# Patient Record
Sex: Female | Born: 1945 | Race: White | Hispanic: No | Marital: Married | Smoking: Never smoker
Health system: Southern US, Community
[De-identification: ages and names within clinical notes are randomized; demographics above are authoritative.]

## PROBLEM LIST (undated history)

## (undated) DIAGNOSIS — J449 Chronic obstructive pulmonary disease, unspecified: Secondary | ICD-10-CM

## (undated) DIAGNOSIS — M25579 Pain in unspecified ankle and joints of unspecified foot: Secondary | ICD-10-CM

## (undated) DIAGNOSIS — F32A Depression, unspecified: Secondary | ICD-10-CM

## (undated) DIAGNOSIS — G3184 Mild cognitive impairment, so stated: Secondary | ICD-10-CM

## (undated) DIAGNOSIS — M810 Age-related osteoporosis without current pathological fracture: Secondary | ICD-10-CM

## (undated) DIAGNOSIS — R634 Abnormal weight loss: Secondary | ICD-10-CM

## (undated) DIAGNOSIS — E785 Hyperlipidemia, unspecified: Secondary | ICD-10-CM

## (undated) DIAGNOSIS — M8580 Other specified disorders of bone density and structure, unspecified site: Secondary | ICD-10-CM

## (undated) DIAGNOSIS — J309 Allergic rhinitis, unspecified: Secondary | ICD-10-CM

## (undated) DIAGNOSIS — L84 Corns and callosities: Secondary | ICD-10-CM

## (undated) DIAGNOSIS — I700 Atherosclerosis of aorta: Secondary | ICD-10-CM

## (undated) DIAGNOSIS — D229 Melanocytic nevi, unspecified: Secondary | ICD-10-CM

## (undated) HISTORY — DX: Melanocytic nevi, unspecified: D22.9

## (undated) HISTORY — DX: Allergic rhinitis, unspecified: J30.9

## (undated) HISTORY — DX: Other specified disorders of bone density and structure, unspecified site: M85.80

## (undated) HISTORY — DX: Atherosclerosis of aorta: I70.0

## (undated) HISTORY — DX: Pain in unspecified ankle and joints of unspecified foot: M25.579

## (undated) HISTORY — DX: Hyperlipidemia, unspecified: E78.5

## (undated) HISTORY — DX: Abnormal weight loss: R63.4

## (undated) HISTORY — DX: Mild cognitive impairment of uncertain or unknown etiology: G31.84

## (undated) HISTORY — DX: Corns and callosities: L84

## (undated) HISTORY — DX: Age-related osteoporosis without current pathological fracture: M81.0

## (undated) HISTORY — DX: Chronic obstructive pulmonary disease, unspecified: J44.9

## (undated) HISTORY — DX: Depression, unspecified: F32.A

---

## 2015-01-07 DIAGNOSIS — Z0000 Encounter for general adult medical examination without abnormal findings: Secondary | ICD-10-CM | POA: Insufficient documentation

## 2015-05-13 ENCOUNTER — Other Ambulatory Visit: Payer: Self-pay | Admitting: Internal Medicine

## 2015-05-13 DIAGNOSIS — R634 Abnormal weight loss: Secondary | ICD-10-CM

## 2015-05-14 ENCOUNTER — Ambulatory Visit
Admission: RE | Admit: 2015-05-14 | Discharge: 2015-05-14 | Disposition: A | Discharge status: Home / Self Care | Payer: Medicare Other | Source: Ambulatory Visit | Attending: Internal Medicine | Admitting: Internal Medicine

## 2015-05-14 DIAGNOSIS — R634 Abnormal weight loss: Secondary | ICD-10-CM

## 2015-05-14 MED ORDER — IOPAMIDOL (ISOVUE-300) INJECTION 61%
100.0000 mL | Freq: Once | INTRAVENOUS | Status: AC | PRN
  Administered 2015-05-14: 100 mL via INTRAVENOUS

## 2015-06-11 ENCOUNTER — Other Ambulatory Visit: Payer: Self-pay

## 2015-06-11 DIAGNOSIS — Z1231 Encounter for screening mammogram for malignant neoplasm of breast: Secondary | ICD-10-CM

## 2015-09-06 ENCOUNTER — Other Ambulatory Visit: Payer: Self-pay | Admitting: Gastroenterology

## 2015-09-06 DIAGNOSIS — K8689 Other specified diseases of pancreas: Secondary | ICD-10-CM

## 2015-09-17 ENCOUNTER — Ambulatory Visit
Admission: RE | Admit: 2015-09-17 | Discharge: 2015-09-17 | Disposition: A | Discharge status: Home / Self Care | Payer: Medicare Other | Source: Ambulatory Visit | Attending: Gastroenterology | Admitting: Gastroenterology

## 2015-09-17 ENCOUNTER — Other Ambulatory Visit: Payer: Medicare Other

## 2015-09-17 DIAGNOSIS — K8689 Other specified diseases of pancreas: Secondary | ICD-10-CM

## 2015-09-17 MED ORDER — GADOBENATE DIMEGLUMINE 529 MG/ML IV SOLN
10.0000 mL | Freq: Once | INTRAVENOUS | Status: AC | PRN
  Administered 2015-09-17: 10 mL via INTRAVENOUS

## 2016-09-15 ENCOUNTER — Ambulatory Visit (INDEPENDENT_AMBULATORY_CARE_PROVIDER_SITE_OTHER): Payer: Medicare Other | Admitting: Podiatry

## 2016-09-15 ENCOUNTER — Ambulatory Visit (INDEPENDENT_AMBULATORY_CARE_PROVIDER_SITE_OTHER): Payer: Medicare Other

## 2016-09-15 VITALS — BP 116/63 | HR 78

## 2016-09-15 DIAGNOSIS — M779 Enthesopathy, unspecified: Secondary | ICD-10-CM | POA: Diagnosis not present

## 2016-09-15 DIAGNOSIS — M2041 Other hammer toe(s) (acquired), right foot: Secondary | ICD-10-CM

## 2016-09-15 DIAGNOSIS — M21619 Bunion of unspecified foot: Secondary | ICD-10-CM

## 2016-09-15 DIAGNOSIS — L84 Corns and callosities: Secondary | ICD-10-CM | POA: Diagnosis not present

## 2016-09-15 DIAGNOSIS — R634 Abnormal weight loss: Secondary | ICD-10-CM | POA: Insufficient documentation

## 2016-09-15 DIAGNOSIS — R635 Abnormal weight gain: Secondary | ICD-10-CM | POA: Insufficient documentation

## 2016-09-15 MED ORDER — TRIAMCINOLONE ACETONIDE 10 MG/ML IJ SUSP
10.0000 mg | Freq: Once | INTRAMUSCULAR | Status: AC
  Administered 2016-09-15: 10 mg

## 2016-09-16 NOTE — Progress Notes (Signed)
Subjective:    Patient ID: Christine House, female   DOB: 71 y.o.   MRN: [id number]   HPI patient presents with pain between the hallux and second toe right over left with structural bunion deformity right over left that is moderately painful when pressed. States the area between the 2 toes is extremely tender    Review of Systems  All other systems reviewed and are negative.       Objective:  Physical Exam  Constitutional: She appears well-developed and well-nourished.  Cardiovascular: Intact distal pulses.   Musculoskeletal: Normal range of motion.  Neurological: She is alert.  Skin: Skin is warm.  Nursing note and vitals reviewed.  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 At this point were to focus on the big toe which is worse and I did do nerve block of the hallux and then using sterile injection I prepped and then injected with a quarter cc of dexamethasone Kenalog and then using sharp sterile his rotation debrided lesion and applied padding. Discussed possible bunion correction in future or other treatment depending on response  X-rays indicate that there is structural deformity with deviation the hallux bilateral with the right showing compression between the lateral hallux and second

## 2017-09-21 ENCOUNTER — Other Ambulatory Visit: Payer: Self-pay | Admitting: Internal Medicine

## 2017-09-21 DIAGNOSIS — Z1231 Encounter for screening mammogram for malignant neoplasm of breast: Secondary | ICD-10-CM

## 2017-10-25 ENCOUNTER — Encounter: Payer: Self-pay | Admitting: Radiology

## 2017-10-25 ENCOUNTER — Ambulatory Visit
Admission: RE | Admit: 2017-10-25 | Discharge: 2017-10-25 | Disposition: A | Discharge status: Home / Self Care | Payer: Medicare Other | Source: Ambulatory Visit | Attending: Internal Medicine | Admitting: Internal Medicine

## 2017-10-25 DIAGNOSIS — Z1231 Encounter for screening mammogram for malignant neoplasm of breast: Secondary | ICD-10-CM

## 2018-01-25 HISTORY — PX: COLONOSCOPY: SHX174

## 2018-11-07 ENCOUNTER — Other Ambulatory Visit: Payer: Self-pay | Admitting: Internal Medicine

## 2018-11-07 DIAGNOSIS — Z1231 Encounter for screening mammogram for malignant neoplasm of breast: Secondary | ICD-10-CM

## 2018-11-27 ENCOUNTER — Other Ambulatory Visit: Payer: Self-pay

## 2018-11-27 DIAGNOSIS — Z20822 Contact with and (suspected) exposure to covid-19: Secondary | ICD-10-CM

## 2018-11-28 LAB — NOVEL CORONAVIRUS, NAA: SARS-CoV-2, NAA: NOT DETECTED

## 2018-12-27 ENCOUNTER — Ambulatory Visit
Admission: RE | Admit: 2018-12-27 | Discharge: 2018-12-27 | Disposition: A | Discharge status: Home / Self Care | Payer: Medicare Other | Source: Ambulatory Visit | Attending: Internal Medicine | Admitting: Internal Medicine

## 2018-12-27 ENCOUNTER — Other Ambulatory Visit: Payer: Self-pay

## 2018-12-27 DIAGNOSIS — Z1231 Encounter for screening mammogram for malignant neoplasm of breast: Secondary | ICD-10-CM

## 2018-12-28 ENCOUNTER — Other Ambulatory Visit: Payer: Self-pay | Admitting: Internal Medicine

## 2018-12-28 DIAGNOSIS — R928 Other abnormal and inconclusive findings on diagnostic imaging of breast: Secondary | ICD-10-CM

## 2019-01-02 ENCOUNTER — Ambulatory Visit: Payer: Medicare Other

## 2019-01-02 ENCOUNTER — Ambulatory Visit
Admission: RE | Admit: 2019-01-02 | Discharge: 2019-01-02 | Disposition: A | Discharge status: Home / Self Care | Payer: Medicare Other | Source: Ambulatory Visit | Attending: Internal Medicine | Admitting: Internal Medicine

## 2019-01-02 ENCOUNTER — Other Ambulatory Visit: Payer: Self-pay

## 2019-01-02 DIAGNOSIS — R928 Other abnormal and inconclusive findings on diagnostic imaging of breast: Secondary | ICD-10-CM

## 2019-01-26 DIAGNOSIS — Z9289 Personal history of other medical treatment: Secondary | ICD-10-CM

## 2019-01-26 HISTORY — DX: Personal history of other medical treatment: Z92.89

## 2019-02-16 ENCOUNTER — Ambulatory Visit: Payer: Medicare PPO | Attending: Internal Medicine

## 2019-02-16 DIAGNOSIS — Z23 Encounter for immunization: Secondary | ICD-10-CM | POA: Insufficient documentation

## 2019-02-16 NOTE — Progress Notes (Signed)
   Covid-19 Vaccination Clinic  Name:  Christine House    MRN: [id number] DOB: 04-27-1945  02/16/2019  [name] was observed post Covid-19 immunization for 15 minutes without incidence. She was provided with Vaccine Information Sheet and instruction to access the V-Safe system.   [name] was instructed to call 911 with any severe reactions post vaccine: Marland Kitchen Difficulty breathing  . Swelling of your face and throat  . A fast heartbeat  . A bad rash all over your body  . Dizziness and weakness    Immunizations Administered    Name Date Dose VIS Date Route   Pfizer COVID-19 Vaccine 02/16/2019  8:54 AM 0.3 mL 01/05/2019 Intramuscular   Manufacturer: [place]   Lot: [id number]   [place]: [id number]

## 2019-03-09 ENCOUNTER — Ambulatory Visit: Payer: Medicare PPO | Attending: Internal Medicine

## 2019-03-09 DIAGNOSIS — Z23 Encounter for immunization: Secondary | ICD-10-CM | POA: Insufficient documentation

## 2019-03-09 NOTE — Progress Notes (Signed)
   Covid-19 Vaccination Clinic  Name:  Christine House    MRN: [id number] DOB: [birth year]  03/09/2019  Christine House was observed post Covid-19 immunization for 15 minutes without incidence. She was provided with Vaccine Information Sheet and instruction to access the V-Safe system.   Christine House was instructed to call 911 with any severe reactions post vaccine: Marland Kitchen Difficulty breathing  . Swelling of your face and throat  . A fast heartbeat  . A bad rash all over your body  . Dizziness and weakness    Immunizations Administered    Name Date Dose VIS Date Route   Pfizer COVID-19 Vaccine 03/09/2019  9:30 AM 0.3 mL 01/05/2019 Intramuscular   Manufacturer: [place]   Lot: [id number]   [place]: [id number]

## 2019-10-27 DIAGNOSIS — Z23 Encounter for immunization: Secondary | ICD-10-CM | POA: Diagnosis not present

## 2019-11-06 DIAGNOSIS — B351 Tinea unguium: Secondary | ICD-10-CM | POA: Diagnosis not present

## 2019-11-06 DIAGNOSIS — M79671 Pain in right foot: Secondary | ICD-10-CM | POA: Diagnosis not present

## 2019-11-06 DIAGNOSIS — L84 Corns and callosities: Secondary | ICD-10-CM | POA: Diagnosis not present

## 2019-11-06 DIAGNOSIS — M79672 Pain in left foot: Secondary | ICD-10-CM | POA: Diagnosis not present

## 2019-11-06 DIAGNOSIS — Q6689 Other  specified congenital deformities of feet: Secondary | ICD-10-CM | POA: Diagnosis not present

## 2019-12-05 ENCOUNTER — Other Ambulatory Visit: Payer: Self-pay | Admitting: Internal Medicine

## 2019-12-05 DIAGNOSIS — Z1231 Encounter for screening mammogram for malignant neoplasm of breast: Secondary | ICD-10-CM

## 2020-02-04 ENCOUNTER — Ambulatory Visit
Admission: RE | Admit: 2020-02-04 | Discharge: 2020-02-04 | Disposition: A | Discharge status: Home / Self Care | Payer: Medicare PPO | Source: Ambulatory Visit | Attending: Internal Medicine | Admitting: Internal Medicine

## 2020-02-04 ENCOUNTER — Other Ambulatory Visit: Payer: Self-pay

## 2020-02-04 DIAGNOSIS — Z1231 Encounter for screening mammogram for malignant neoplasm of breast: Secondary | ICD-10-CM | POA: Diagnosis not present

## 2020-03-14 DIAGNOSIS — H2513 Age-related nuclear cataract, bilateral: Secondary | ICD-10-CM | POA: Diagnosis not present

## 2020-03-14 DIAGNOSIS — H5203 Hypermetropia, bilateral: Secondary | ICD-10-CM | POA: Diagnosis not present

## 2020-04-03 DIAGNOSIS — F0281 Dementia in other diseases classified elsewhere with behavioral disturbance: Secondary | ICD-10-CM | POA: Diagnosis not present

## 2020-04-03 DIAGNOSIS — Z79899 Other long term (current) drug therapy: Secondary | ICD-10-CM | POA: Diagnosis not present

## 2020-04-03 DIAGNOSIS — F419 Anxiety disorder, unspecified: Secondary | ICD-10-CM | POA: Diagnosis not present

## 2020-04-03 DIAGNOSIS — G301 Alzheimer's disease with late onset: Secondary | ICD-10-CM | POA: Diagnosis not present

## 2020-06-03 DIAGNOSIS — M79672 Pain in left foot: Secondary | ICD-10-CM | POA: Diagnosis not present

## 2020-06-03 DIAGNOSIS — B351 Tinea unguium: Secondary | ICD-10-CM | POA: Diagnosis not present

## 2020-06-03 DIAGNOSIS — M79671 Pain in right foot: Secondary | ICD-10-CM | POA: Diagnosis not present

## 2020-06-03 DIAGNOSIS — L84 Corns and callosities: Secondary | ICD-10-CM | POA: Diagnosis not present

## 2020-06-13 ENCOUNTER — Encounter: Payer: Self-pay | Admitting: Nurse Practitioner

## 2020-06-13 ENCOUNTER — Non-Acute Institutional Stay (SKILLED_NURSING_FACILITY): Payer: Medicare PPO | Admitting: Nurse Practitioner

## 2020-06-13 DIAGNOSIS — F418 Other specified anxiety disorders: Secondary | ICD-10-CM

## 2020-06-13 DIAGNOSIS — F0280 Dementia in other diseases classified elsewhere without behavioral disturbance: Secondary | ICD-10-CM | POA: Diagnosis not present

## 2020-06-13 DIAGNOSIS — E785 Hyperlipidemia, unspecified: Secondary | ICD-10-CM

## 2020-06-13 DIAGNOSIS — E559 Vitamin D deficiency, unspecified: Secondary | ICD-10-CM

## 2020-06-13 DIAGNOSIS — M810 Age-related osteoporosis without current pathological fracture: Secondary | ICD-10-CM

## 2020-06-13 DIAGNOSIS — G309 Alzheimer's disease, unspecified: Secondary | ICD-10-CM

## 2020-06-13 DIAGNOSIS — E538 Deficiency of other specified B group vitamins: Secondary | ICD-10-CM | POA: Diagnosis not present

## 2020-06-13 NOTE — Progress Notes (Signed)
Location:   SNF [place] Room Number: 110 Place of Service:    Provider: Marlana Latus NP  Shon Baton, MD  Patient Care Team: Shon Baton, MD as PCP - General (Internal Medicine)  Extended Emergency Contact Information Primary Emergency Contact: Reiger,[name]  [place] Phone: [phone] Relation: Spouse  Code Status: DNR Goals of care: Advanced Directive information Advanced Directives 06/17/2020  Does Patient Have a Medical Advance Directive? Yes  Type of Paramedic of [place];Living will  Does patient want to make changes to medical advance directive? No - Patient declined  Copy of [place] in Chart? Yes - validated most recent copy scanned in chart (See row information)     Chief Complaint  Patient presents with  . Acute Visit    Elopement    HPI:  Pt is a 75 y.o. female seen today for an acute visit for elopement episode when the patient was in her town home [place]. The patient admitted to memory care unit [facility] subsequently for safety, care needs.    Hx of dementia, takes Donepezil 5mg  qd, no CT/MRI of brain located. F/u [facility] memory care center.  Hyperlipidemia, takes Rosuvastatin  Depression/anxiety, takes Escitalopram 10mg  qd, Buspirone 5mg  tid, reported the patient anxiousness, up 3-4am, wandering.   Vit B12 deficiency, takes Vit B 12 3000mg  qd  Vit D deficiency, takes Vit 2000 u daily  OP, takes Alendronate(started 2019 after foot fx)  No DEXA located.    History reviewed. No pertinent past medical history. History reviewed. No pertinent surgical history.  Allergies  Allergen Reactions  . Sertraline Other (See Comments)    Suicidal thoughts  . Penicillin G Rash    Welts    Allergies as of 06/13/2020      Reactions   Sertraline Other (See Comments)   Suicidal thoughts   Penicillin G Rash   Welts      Medication List       Accurate as of Jun 13, 2020 11:59 PM. If you have any  questions, ask your nurse or doctor.        aspirin EC 81 MG tablet Take 81 mg by mouth.   busPIRone 5 MG tablet Commonly known as: BUSPAR Take 5 mg by mouth 3 (three) times daily.   Calcium 500 + D 500-125 MG-UNIT Tabs Generic drug: Calcium Carbonate-Vitamin D Take by mouth.   cyanocobalamin 2000 MCG tablet Take by mouth.   donepezil 5 MG tablet Commonly known as: ARICEPT   loratadine 10 MG tablet Commonly known as: CLARITIN Take 10 mg by mouth.   multivitamin capsule Take by mouth.   rosuvastatin 5 MG tablet Commonly known as: CRESTOR take 1 tablet by mouth once daily   rosuvastatin 10 MG tablet Commonly known as: CRESTOR Take 10 mg by mouth daily.   Shingrix injection Generic drug: Zoster Vaccine Adjuvanted inject 0.5 milliliter intramuscularly       Review of Systems  Constitutional: Negative for activity change, appetite change and fever.  HENT: Negative for congestion, hearing loss and trouble swallowing.   Eyes: Negative for visual disturbance.  Respiratory: Negative for cough and shortness of breath.   Cardiovascular: Negative for leg swelling.  Gastrointestinal: Negative for abdominal pain, constipation, nausea and vomiting.  Genitourinary: Negative for dysuria, frequency and urgency.  Musculoskeletal: Negative for gait problem.  Skin: Negative for color change.  Neurological: Negative for speech difficulty, weakness and headaches.       Memory lapses.  Psychiatric/Behavioral:       Reported the patient's anxious episodes, early am awake, wandering.     Immunization History  Administered Date(s) Administered  . Influenza, High Dose Seasonal PF 11/21/2013  . PFIZER(Purple Top)SARS-COV-2 Vaccination 02/16/2019, 03/09/2019, 10/26/2019  . PPD Test 10/23/2005  . Pneumococcal Conjugate-13 08/08/2013  . Pneumococcal Polysaccharide-23 12/27/2011  . Tdap 11/28/2009  . Zoster 03/15/2005   Pertinent  Health Maintenance Due  Topic Date Due  . DEXA  SCAN  Never done  . INFLUENZA VACCINE  08/25/2020  . PNA vac Low Risk Adult  Completed   No flowsheet data found. Functional Status Survey:    Vitals:   06/13/20 1116  BP: 126/62  Pulse: (!) 52  Resp: 20  Temp: (!) 97.5 F (36.4 C)  SpO2: 98%   There is no height or weight on file to calculate BMI. Physical Exam Vitals and nursing note reviewed.  Constitutional:      Appearance: Normal appearance.  HENT:     Head: Normocephalic and atraumatic.     Nose: Nose normal.     Mouth/Throat:     Mouth: Mucous membranes are moist.  Eyes:     Extraocular Movements: Extraocular movements intact.     Conjunctiva/sclera: Conjunctivae normal.     Pupils: Pupils are equal, round, and reactive to light.  Cardiovascular:     Rate and Rhythm: Normal rate and regular rhythm.     Heart sounds: No murmur heard.   Pulmonary:     Effort: Pulmonary effort is normal.     Breath sounds: No wheezing, rhonchi or rales.  Abdominal:     General: Bowel sounds are normal.     Palpations: Abdomen is soft.  Musculoskeletal:     Cervical back: Normal range of motion and neck supple.     Right lower leg: No edema.     Left lower leg: No edema.  Skin:    General: Skin is warm and dry.  Neurological:     General: No focal deficit present.     Mental Status: She is alert. Mental status is at baseline.     Motor: No weakness.     Gait: Gait normal.     Comments: Oriented to person, place  Psychiatric:        Mood and Affect: Mood normal.     Comments: Smile, conversing appropriately during my examination.      Labs reviewed: No results for input(s): NA, K, CL, CO2, GLUCOSE, BUN, CREATININE, CALCIUM, MG, PHOS in the last 8760 hours. No results for input(s): AST, ALT, ALKPHOS, BILITOT, PROT, ALBUMIN in the last 8760 hours. No results for input(s): WBC, NEUTROABS, HGB, HCT, MCV, PLT in the last 8760 hours. No results found for: TSH No results found for: HGBA1C No results found for: CHOL, HDL,  LDLCALC, LDLDIRECT, TRIG, CHOLHDL  Significant Diagnostic Results in last 30 days:  No results found.  Assessment/Plan: Alzheimer disease [facility]) Admitted to memory care unit for safety, care assistance, continue Donepezil. MMSE, obtain TSH, CBC/diff, CMP/eGFR  Hyperlipidemia takes Rosuvastatin, obtain lipid panel.    Depression with anxiety takes Escitalopram 10mg  qd, Buspirone 5mg  tid, reported the patient anxiousness, up 3-4am, wandering. Adding Lorazepam 0.5mg  bid prn x 14 day.   Vitamin B12 deficiency takes Vit B 12 3000mg  qd, obtain Vit B12 level.   Vitamin D deficiency takes Vit 2000 u daily, obtain Vit D level.    Osteoporosis Takes Alendronate.     Family/ staff Communication: plan of care  reviewed with the patient and charge nurse.   Labs/tests ordered:  CBC/diff, CMP/eGFR, TSH, Vit D, lipid panel, Vit B12 level.   Time spend 35 minutes.

## 2020-06-13 NOTE — Assessment & Plan Note (Signed)
takes Vit 2000 u daily, obtain Vit D level.

## 2020-06-13 NOTE — Assessment & Plan Note (Signed)
takes Vit B 12 3000mg  qd, obtain Vit B12 level.

## 2020-06-13 NOTE — Assessment & Plan Note (Signed)
takes Rosuvastatin, obtain lipid panel.

## 2020-06-13 NOTE — Assessment & Plan Note (Signed)
Takes Alendronate.  

## 2020-06-13 NOTE — Assessment & Plan Note (Signed)
Admitted to memory care unit for safety, care assistance, continue Donepezil. MMSE, obtain TSH, CBC/diff, CMP/eGFR

## 2020-06-13 NOTE — Assessment & Plan Note (Signed)
takes Escitalopram 10mg  qd, Buspirone 5mg  tid, reported the patient anxiousness, up 3-4am, wandering. Adding Lorazepam 0.5mg  bid prn x 14 day.

## 2020-06-17 ENCOUNTER — Non-Acute Institutional Stay (SKILLED_NURSING_FACILITY): Payer: Medicare PPO | Admitting: Internal Medicine

## 2020-06-17 ENCOUNTER — Encounter: Payer: Self-pay | Admitting: Internal Medicine

## 2020-06-17 DIAGNOSIS — F0280 Dementia in other diseases classified elsewhere without behavioral disturbance: Secondary | ICD-10-CM | POA: Diagnosis not present

## 2020-06-17 DIAGNOSIS — E538 Deficiency of other specified B group vitamins: Secondary | ICD-10-CM

## 2020-06-17 DIAGNOSIS — E785 Hyperlipidemia, unspecified: Secondary | ICD-10-CM | POA: Diagnosis not present

## 2020-06-17 DIAGNOSIS — G309 Alzheimer's disease, unspecified: Secondary | ICD-10-CM

## 2020-06-17 DIAGNOSIS — F418 Other specified anxiety disorders: Secondary | ICD-10-CM | POA: Diagnosis not present

## 2020-06-17 DIAGNOSIS — M810 Age-related osteoporosis without current pathological fracture: Secondary | ICD-10-CM

## 2020-06-17 DIAGNOSIS — F0281 Dementia in other diseases classified elsewhere with behavioral disturbance: Secondary | ICD-10-CM | POA: Diagnosis not present

## 2020-06-17 DIAGNOSIS — E559 Vitamin D deficiency, unspecified: Secondary | ICD-10-CM | POA: Diagnosis not present

## 2020-06-17 DIAGNOSIS — I10 Essential (primary) hypertension: Secondary | ICD-10-CM | POA: Diagnosis not present

## 2020-06-17 DIAGNOSIS — R41841 Cognitive communication deficit: Secondary | ICD-10-CM | POA: Diagnosis not present

## 2020-06-17 MED ORDER — CLONAZEPAM 0.5 MG PO TABS: 0.5000 mg | ORAL_TABLET | Freq: Two times a day (BID) | ORAL | 1 refills | Status: DC

## 2020-06-17 NOTE — Progress Notes (Signed)
Provider:  Veleta Miners MD Location:   [place] Room Number: 110 Place of Service:  SNF ([id number])  PCP: Shon Baton, MD Patient Care Team: Shon Baton, MD as PCP - General (Internal Medicine)  Extended Emergency Contact Information Primary Emergency Contact: Tatem,[name]  [place] Phone: [phone] Relation: Spouse  Code Status: Full Code Goals of Care: Advanced Directive information Advanced Directives 06/17/2020  Does Patient Have a Medical Advance Directive? Yes  Type of Paramedic of [place];Living will  Does patient want to make changes to medical advance directive? No - Patient declined  Copy of [place] in Chart? Yes - validated most recent copy scanned in chart (See row information)      Chief Complaint  Patient presents with  . New Admit To SNF    Admission to SNF    HPI: Patient is a 75 y.o. female seen today for admission to Memory Unit  Patient new Admit to memory unit for safety Patient has a history of Alzheimer's disease,, osteoporosis, vitamin D and B12 deficiency, hyperlipidemia, anxiety  Patient has just moved to [facility] 10 months ago with her husband.  Per her husband and there has been on a steep decline in her dementia.   In her apartment patient would get very nervous paranoid.  Would sometimes hide in the closet   Most of the symptoms would happen in the evening till and then early morning.   She  did elope many times but would not go far off but before the admission patient did leave until she was found by the facility staff.  Her husband decided to move her to memory unit They had number of questions today. Per staff patient continues to have a lot of anxiety.  Keeps telling them that she needs to find her husband.  Tries to leave the facility. When her husband left today  patient started pacing around.  And she asked me if she can have my phone so she  can call her husband.  Very anxious. Does not remember Eloping   History reviewed. No pertinent past medical history. History reviewed. No pertinent surgical history.  has no history on file for tobacco use, alcohol use, and drug use. Social History   Socioeconomic History  . Marital status: Married    Spouse name: Not on file  . Number of children: Not on file  . Years of education: Not on file  . Highest education level: Not on file  Occupational History  . Not on file  Tobacco Use  . Smoking status: Not on file  . Smokeless tobacco: Not on file  Substance and Sexual Activity  . Alcohol use: Not on file  . Drug use: Not on file  . Sexual activity: Not on file  Other Topics Concern  . Not on file  Social History Narrative  . Not on file   Social Determinants of Health   Financial Resource Strain: Not on file  Food Insecurity: Not on file  Transportation Needs: Not on file  Physical Activity: Not on file  Stress: Not on file  Social Connections: Not on file  Intimate Partner Violence: Not on file    Functional Status Survey:    History reviewed. No pertinent family history.  Health Maintenance  Topic Date Due  . Hepatitis C Screening  Never done  . DEXA SCAN  Never done  . TETANUS/TDAP  11/29/2019  . INFLUENZA VACCINE  08/25/2020  .  COVID-19 Vaccine  Completed  . PNA vac Low Risk Adult  Completed  . HPV VACCINES  Aged Out    Allergies  Allergen Reactions  . Sertraline Other (See Comments)    Suicidal thoughts  . Penicillin G Rash    Welts    Allergies as of 06/17/2020      Reactions   Sertraline Other (See Comments)   Suicidal thoughts   Penicillin G Rash   Welts      Medication List       Accurate as of Jun 17, 2020 11:13 AM. If you have any questions, ask your nurse or doctor.        STOP taking these medications   aspirin EC 81 MG tablet Stopped by: [name], MD   Calcium 500 + D 500-125 MG-UNIT Tabs Generic drug: Calcium  Carbonate-Vitamin D Stopped by: [name], MD   loratadine 10 MG tablet Commonly known as: CLARITIN Stopped by: [name], MD     TAKE these medications   alendronate 70 MG tablet Commonly known as: FOSAMAX Take 70 mg by mouth once a week. Take with a full glass of water on an empty stomach.   busPIRone 5 MG tablet Commonly known as: BUSPAR Take 5 mg by mouth 3 (three) times daily.   cyanocobalamin 2000 MCG tablet Take by mouth.   donepezil 5 MG tablet Commonly known as: ARICEPT   escitalopram 10 MG tablet Commonly known as: LEXAPRO Take 10 mg by mouth daily.   LORazepam 0.5 MG tablet Commonly known as: ATIVAN Take 0.5 mg by mouth 2 (two) times daily.   multivitamin capsule Take by mouth.   rosuvastatin 10 MG tablet Commonly known as: CRESTOR Take 10 mg by mouth daily. What changed: Another medication with the same name was removed. Continue taking this medication, and follow the directions you see here. Changed by: [name], MD   Shingrix injection Generic drug: Zoster Vaccine Adjuvanted inject 0.5 milliliter intramuscularly   Vitamin D 50 MCG (2000 UT) tablet Take 2,000 Units by mouth daily.       Review of Systems  Review of Systems  Constitutional: Negative for activity change, appetite change, chills, diaphoresis, fatigue and fever.  HENT: Negative for mouth sores, postnasal drip, rhinorrhea, sinus pain and sore throat.   Respiratory: Negative for apnea, cough, chest tightness, shortness of breath and wheezing.   Cardiovascular: Negative for chest pain, palpitations and leg swelling.  Gastrointestinal: Negative for abdominal distention, abdominal pain, constipation, diarrhea, nausea and vomiting.  Genitourinary: Negative for dysuria and frequency.  Musculoskeletal: Negative for arthralgias, joint swelling and myalgias.  Skin: Negative for rash.  Neurological: Negative for dizziness, syncope, weakness, light-headedness and numbness.   Psychiatric/Behavioral: Negative for behavioral problems, confusion and sleep disturbance.     Vitals:   06/17/20 1049  BP: 130/64  Pulse: 60  Resp: 20  Temp: 97.9 F (36.6 C)  SpO2: 96%  Weight: 105 lb 6.4 oz (47.8 kg)  Height: 5\' 5"  (1.651 m)   Body mass index is 17.54 kg/m. Physical Exam  Constitutional: . Well-developed and well-nourished.  HENT:  Head: Normocephalic.  Mouth/Throat: Oropharynx is clear and moist.  Eyes: Pupils are equal, round, and reactive to light.  Neck: Neck supple.  Cardiovascular: Normal rate and normal heart sounds.  No murmur heard. Pulmonary/Chest: Effort normal and breath sounds normal. No respiratory distress. No wheezes. She has no rales.  Abdominal: Soft. Bowel sounds are normal. No distension. There is no  tenderness. There is no rebound.  Musculoskeletal: No edema.  Lymphadenopathy: none Neurological: No Focal Deficits Gait stable Answers Appropriately Repeats herself No aphasia Skin: Skin is warm and dry.  Psychiatric: Normal mood and affect. Behavior is normal. Thought content normal.    Labs reviewed: Basic Metabolic Panel: No results for input(s): NA, K, CL, CO2, GLUCOSE, BUN, CREATININE, CALCIUM, MG, PHOS in the last 8760 hours. Liver Function Tests: No results for input(s): AST, ALT, ALKPHOS, BILITOT, PROT, ALBUMIN in the last 8760 hours. No results for input(s): LIPASE, AMYLASE in the last 8760 hours. No results for input(s): AMMONIA in the last 8760 hours. CBC: No results for input(s): WBC, NEUTROABS, HGB, HCT, MCV, PLT in the last 8760 hours. Cardiac Enzymes: No results for input(s): CKTOTAL, CKMB, CKMBINDEX, TROPONINI in the last 8760 hours. BNP: Invalid input(s): POCBNP No results found for: HGBA1C No results found for: TSH No results found for: VITAMINB12 No results found for: FOLATE No results found for: IRON, TIBC, FERRITIN  Imaging and Procedures obtained prior to SNF admission: MM 3D SCREEN BREAST  BILATERAL  Result Date: 02/04/2020 CLINICAL DATA:  Screening. EXAM: DIGITAL SCREENING BILATERAL MAMMOGRAM WITH TOMO AND CAD COMPARISON:  Previous exam(s). ACR Breast Density Category b: There are scattered areas of fibroglandular density. FINDINGS: There are no findings suspicious for malignancy. Images were processed with CAD. IMPRESSION: No mammographic evidence of malignancy. A result letter of this screening mammogram will be mailed directly to the patient. RECOMMENDATION: Screening mammogram in one year. (Code:SM-B-01Y) BI-RADS CATEGORY  1: Negative. Electronically Signed   By: Evangeline Dakin M.D.   On: 02/04/2020 11:38    Assessment/Plan .Alzheimer disease [facility]) with Behavior issues MMSe in Facility is 17/30. Failed Clock Drawing Buspar not effective Will taper it and Start Klonopin 0 open.5 mg BID Also on Ativan PRN Start on Depakote 125 mg BID.  Repeat Labs on 4 weeks Discussed in detail with the patient's husband. Will Consider adding Namenda once she stabilizes on these Meds  Osteoporosis, unspecified osteoporosis type, unspecified pathological fracture presence On Fosamax Will need DEXA scan Follow up  Vitamin D deficiency On Supplement Vitamin B12 deficiency Check the levals Hyperlipidemia, unspecified hyperlipidemia type Lipid Panel Pending Depression with anxiety Conitnue Lexapro    Family/ staff Communication:   Labs/tests ordered:CBC,CMP,B12 level in 4 weeks    Total time spent in this patient care encounter was  45 _  minutes; greater than 50% of the visit spent counseling patient and staff, reviewing records , Labs and coordinating care for problems addressed at this encounter.

## 2020-06-18 LAB — LIPID PANEL
Cholesterol: 178 (ref 0–200)
HDL: 77 — AB (ref 35–70)
LDL Cholesterol: 86
LDl/HDL Ratio: 2.3
Triglycerides: 61 (ref 40–160)

## 2020-06-18 LAB — CBC AND DIFFERENTIAL
HCT: 45 (ref 36–46)
Hemoglobin: 15 (ref 12.0–16.0)
Neutrophils Absolute: 2903
Platelets: 337 (ref 150–399)
WBC: 5.9

## 2020-06-18 LAB — BASIC METABOLIC PANEL
BUN: 12 (ref 4–21)
BUN: 12 (ref 4–21)
CO2: 29 — AB (ref 13–22)
Chloride: 100 (ref 99–108)
Creatinine: 0.8 (ref 0.5–1.1)
Creatinine: 0.8 (ref 0.5–1.1)
Glucose: 68
Glucose: 68
Potassium: 4.1 (ref 3.4–5.3)
Sodium: 136 — AB (ref 137–147)

## 2020-06-18 LAB — TSH: TSH: 1.87 (ref 0.41–5.90)

## 2020-06-18 LAB — COMPREHENSIVE METABOLIC PANEL
Albumin: 4.2 (ref 3.5–5.0)
Calcium: 9.3 (ref 8.7–10.7)
Globulin: 2.2

## 2020-06-18 LAB — HEPATIC FUNCTION PANEL
ALT: 18 (ref 7–35)
AST: 23 (ref 13–35)
Alkaline Phosphatase: 59 (ref 25–125)
Bilirubin, Total: 0.6

## 2020-06-18 LAB — CBC: RBC: 4.63 (ref 3.87–5.11)

## 2020-06-18 LAB — VITAMIN B12: Vitamin B-12: 551

## 2020-06-18 LAB — VITAMIN D 25 HYDROXY (VIT D DEFICIENCY, FRACTURES): Vit D, 25-Hydroxy: 31

## 2020-06-20 ENCOUNTER — Encounter: Payer: Self-pay | Admitting: Internal Medicine

## 2020-06-26 ENCOUNTER — Non-Acute Institutional Stay (SKILLED_NURSING_FACILITY): Payer: Medicare PPO | Admitting: Internal Medicine

## 2020-06-26 ENCOUNTER — Encounter: Payer: Self-pay | Admitting: Internal Medicine

## 2020-06-26 ENCOUNTER — Telehealth: Payer: Self-pay | Admitting: Internal Medicine

## 2020-06-26 DIAGNOSIS — G309 Alzheimer's disease, unspecified: Secondary | ICD-10-CM

## 2020-06-26 DIAGNOSIS — F0280 Dementia in other diseases classified elsewhere without behavioral disturbance: Secondary | ICD-10-CM

## 2020-06-26 NOTE — Telephone Encounter (Signed)
I have answered all his question through My chart message. If he calls tell him he can check that.

## 2020-06-26 NOTE — Telephone Encounter (Signed)
Pts husband(Christine House) called about how to handle [name]'s upcoming annual appt that is due with Dr Virgina Jock & how that will be handled since she is in memory care.  Mr Eber is concerned & needs more direction on where & what is to be done from this point( new to [facility]) on care.  He's getting mixed signals from "staff" at memory care unit on whether he should visit more OR less! How much is more or less?  Can wife(Christine House) have brief outings(drives or walks) with husband?  Please advise, Christine House

## 2020-06-26 NOTE — Progress Notes (Signed)
Location:   [place] Room Number: 110 Place of Service:  SNF [id number]) Provider:  Veleta Miners MD  [name], MD  Patient Care Team: [name], MD as PCP - General (Internal Medicine)  Extended Emergency Contact Information Primary Emergency Contact: Pollyann [place] of [place] Phone: [phone] Relation: Spouse Secondary Emergency Contact: Delecia, Vastine Mobile Phone: [phone] Relation: Other  Code Status:  Full Code Managed Care Goals of care: Advanced Directive information Advanced Directives 06/17/2020  Does Patient Have a Medical Advance Directive? Yes  Type of Paramedic of [place];Living will  Does patient want to make changes to medical advance directive? No - Patient declined  Copy of [place] in Chart? Yes - validated most recent copy scanned in chart (See row information)     Chief Complaint  Patient presents with  . Acute Visit    Follow Up    HPI:  Pt is a 75 y.o. female seen today for an acute visit for Behaviors  Patient is a new Admit to memory unit for safety Patient has a history of Alzheimer's disease,, osteoporosis, vitamin D and B12 deficiency, hyperlipidemia, anxiety  Patient has just moved to [facility] 10 months ago with her husband.  Per her husband there has been on a steep decline in her dementia.   In her apartment patient would get very nervous paranoid.  Would sometimes hide in the closet   Most of the symptoms would happen in the evening till and then early morning.   She  did elope many times but would not go far off  Before the admission patient did leave her apartment and  e was found by the facility staff.  Her husband decided to move her to memory unit  Since she has been here she has been trying to Elope the facility Was getting very upset with her husband Was started on Klonopin, Depakote and Ativan PRN also started her on  Seroquel few days ago Since then she is better. Today she was in her room Packing her clothes as she said she is going today  Back to her home Paces around in the unit looking a way to get out  Past Medical History:  Diagnosis Date  . H/O mammogram 2021   Per [facility] Patient Packet   Past Surgical History:  Procedure Laterality Date  . COLONOSCOPY  2020   Per [facility] New Patient Packet    Allergies  Allergen Reactions  . Sertraline Other (See Comments)    Suicidal thoughts  . Penicillin G Rash    Welts    Allergies as of 06/26/2020      Reactions   Sertraline Other (See Comments)   Suicidal thoughts   Penicillin G Rash   Welts      Medication List       Accurate as of June 26, 2020  4:22 PM. If you have any questions, ask your nurse or doctor.        alendronate 70 MG tablet Commonly known as: FOSAMAX Take 70 mg by mouth once a week. Take with a full glass of water on an empty stomach.   clonazePAM 0.5 MG tablet Commonly known as: KLONOPIN Take 1 tablet (0.5 mg total) by mouth 2 (two) times daily.   CYANOCOBALAMIN PO Take 3,000 mcg by mouth daily.   divalproex 125 MG DR tablet Commonly known as: DEPAKOTE Take 125 mg by mouth 2 (two) times  daily.   donepezil 5 MG tablet Commonly known as: ARICEPT   escitalopram 10 MG tablet Commonly known as: LEXAPRO Take 10 mg by mouth daily.   LORazepam 0.5 MG tablet Commonly known as: ATIVAN Take 0.5 mg by mouth every 6 (six) hours as needed.   multivitamin capsule Take by mouth.   QUEtiapine 25 MG tablet Commonly known as: SEROQUEL Take 12.5 mg by mouth 2 (two) times daily.   rosuvastatin 10 MG tablet Commonly known as: CRESTOR Take 10 mg by mouth daily.   Shingrix injection Generic drug: Zoster Vaccine Adjuvanted inject 0.5 milliliter intramuscularly   Vitamin D 50 MCG (2000 UT) tablet Take 2,000 Units by mouth daily.       Review of Systems  Unable to perform ROS: Dementia    Immunization History   Administered Date(s) Administered  . Influenza, High Dose Seasonal PF 11/21/2013  . PFIZER(Purple Top)SARS-COV-2 Vaccination 02/16/2019, 03/09/2019, 10/26/2019  . PPD Test 10/23/2005  . Pneumococcal Conjugate-13 08/08/2013  . Pneumococcal Polysaccharide-23 12/27/2011, 01/25/2018  . Tdap 11/28/2009  . Zoster, Live 03/15/2005   Pertinent  Health Maintenance Due  Topic Date Due  . DEXA SCAN  Never done  . INFLUENZA VACCINE  08/25/2020  . PNA vac Low Risk Adult  Completed   No flowsheet data found. Functional Status Survey:    Vitals:   06/26/20 1550  BP: (!) 152/76  Pulse: 64  Resp: 18  Temp: (!) 97.5 F (36.4 C)  SpO2: 97%  Weight: 106 lb 6.4 oz (48.3 kg)  Height: 5\' 5"  (1.651 m)   Body mass index is 17.71 kg/m. Physical Exam Constitutional: . Well-developed and well-nourished.  HENT:  Head: Normocephalic.  Mouth/Throat: Oropharynx is clear and moist.  Eyes: Pupils are equal, round, and reactive to light.  Neck: Neck supple.  Cardiovascular: Normal rate and normal heart sounds.  No murmur heard. Pulmonary/Chest: Effort normal and breath sounds normal. No respiratory distress. No wheezes. She has no rales.  Abdominal: Soft. Bowel sounds are normal. No distension. There is no tenderness. There is no rebound.  Musculoskeletal: No edema.  Lymphadenopathy: none Neurological: No Focal Deficit  Skin: Skin is warm and dry.  Psychiatric: Very Anxious And Paronoid Labs reviewed: Recent Labs    06/18/20 0000  NA 136*  K 4.1  CL 100  CO2 29*  BUN 12  12  CREATININE 0.8  0.8  CALCIUM 9.3   Recent Labs    06/18/20 0000  AST 23  ALT 18  ALKPHOS 59  ALBUMIN 4.2   Recent Labs    06/18/20 0000  WBC 5.9  NEUTROABS 2,903.00  HGB 15.0  HCT 45  PLT 337   Lab Results  Component Value Date   TSH 1.87 06/18/2020   No results found for: HGBA1C Lab Results  Component Value Date   CHOL 178 06/18/2020   HDL 77 (A) 06/18/2020   LDLCALC 86 06/18/2020   TRIG  61 06/18/2020    Significant Diagnostic Results in last 30 days:  No results found.  Assessment/Plan  Alzheimer disease (HCC)MMSE in Facility is 17/30. Failed Clock Drawing In Depakote Repeat Blood work pending Continue Klonopin Increase Seroquel 25 mg BID Also on Ativan PRN D/w Husband by My Chart messages Will Consider adding Namenda once she stabilizes on these Meds  Other issues Osteoporosis, unspecified osteoporosis type, unspecified pathological fracture presence On Fosamax Will need DEXA scan Follow up  Vitamin D deficiency On Supplement Vitamin B12 deficiency Check the levals Hyperlipidemia, unspecified hyperlipidemia type  Lipid Panel Pending Depression with anxiety Conitnue Lexapro  Family/ staff Communication:   Labs/tests ordered:

## 2020-07-04 ENCOUNTER — Non-Acute Institutional Stay (SKILLED_NURSING_FACILITY): Payer: Medicare PPO | Admitting: Nurse Practitioner

## 2020-07-04 ENCOUNTER — Encounter: Payer: Self-pay | Admitting: Nurse Practitioner

## 2020-07-04 DIAGNOSIS — G309 Alzheimer's disease, unspecified: Secondary | ICD-10-CM | POA: Diagnosis not present

## 2020-07-04 DIAGNOSIS — F418 Other specified anxiety disorders: Secondary | ICD-10-CM

## 2020-07-04 DIAGNOSIS — F0280 Dementia in other diseases classified elsewhere without behavioral disturbance: Secondary | ICD-10-CM

## 2020-07-04 DIAGNOSIS — E538 Deficiency of other specified B group vitamins: Secondary | ICD-10-CM

## 2020-07-04 DIAGNOSIS — E559 Vitamin D deficiency, unspecified: Secondary | ICD-10-CM | POA: Diagnosis not present

## 2020-07-04 DIAGNOSIS — E785 Hyperlipidemia, unspecified: Secondary | ICD-10-CM | POA: Diagnosis not present

## 2020-07-04 DIAGNOSIS — M810 Age-related osteoporosis without current pathological fracture: Secondary | ICD-10-CM | POA: Diagnosis not present

## 2020-07-04 NOTE — Assessment & Plan Note (Signed)
takes Rosuvastatin, LDL 86 06/17/20

## 2020-07-04 NOTE — Assessment & Plan Note (Signed)
Vit B12 deficiency, takes Vit B 12 3000mg  qd, Vit B12 level 551 06/17/20

## 2020-07-04 NOTE — Assessment & Plan Note (Signed)
takes Alendronate(started 2019 after foot fx)  needs DEXA

## 2020-07-04 NOTE — Progress Notes (Signed)
Location:   [place] Room Number: [place] of Service:  SNF (31) Provider:  Bobbie Valletta Otho Darner, NP    Patient Care Team: [name], MD as PCP - General (Internal Medicine)  Extended Emergency Contact Information Primary Emergency Contact: [place] of [place] Phone: [phone] Relation: Spouse Secondary Emergency Contact: Airl[place], [place] Mobile Phone: [phone] Relation: Other  Code Status:  FULL CODE Goals of care: Advanced Directive information Advanced Directives 07/04/2020  Does Patient Have a Medical Advance Directive? Yes  Type of Paramedic of [place];Living will  Does patient want to make changes to medical advance directive? No - Patient declined  Copy of [place] in Chart? Yes - validated most recent copy scanned in chart (See row information)     Chief Complaint  Patient presents with   Acute Visit    Patient presents for worsened restless and irritable mood.    HPI:  Pt is a 75 y.o. female seen today for an acute visit for persisted restlessness, angry spells, packing up her belongs and through them out, prn Lorazepam used often with little efficacy.     Hx of Alzheimer's dementia, takes Donepezil 5mg  qd, no CT/MRI of brain located. F/u [facility] memory care center, MMSE 17/30. May consider adding Memantine. TSH 1.875/24/22             Hyperlipidemia, takes Rosuvastatin, LDL 86 06/17/20             Depression/anxiety, takes Escitalopram 10mg  qd. On  Seroquel 25mg  bid since 06/26/20. Takes Depakote 125mg  bid and Clonazepam 0.5mg  bid             Vit B12 deficiency, takes Vit B 12 3000mg  qd, Vit B12 level 551 06/17/20             Vit D deficiency, takes Vit 2000 u daily             OP, takes Alendronate(started 2019 after foot fx) needs DEXA  Past Medical History:  Diagnosis Date   H/O mammogram 2021   Per [facility] Patient Packet   Past Surgical History:  Procedure  Laterality Date   COLONOSCOPY  2020   Per [facility] New Patient Packet    Allergies  Allergen Reactions   Sertraline Other (See Comments)    Suicidal thoughts   Penicillin G Rash    Welts    Allergies as of 07/04/2020       Reactions   Sertraline Other (See Comments)   Suicidal thoughts   Penicillin G Rash   Welts        Medication List        Accurate as of July 04, 2020  3:18 PM. If you have any questions, ask your nurse or doctor.          alendronate 70 MG tablet Commonly known as: FOSAMAX Take 70 mg by mouth once a week. Take with a full glass of water on an empty stomach.   clonazePAM 0.5 MG tablet Commonly known as: KLONOPIN Take 1 tablet (0.5 mg total) by mouth 2 (two) times daily.   CYANOCOBALAMIN PO Take 3,000 mcg by mouth daily.   divalproex 125 MG DR tablet Commonly known as: DEPAKOTE Take 125 mg by mouth 2 (two) times daily.   donepezil 5 MG tablet Commonly known as: ARICEPT   escitalopram 10 MG tablet Commonly known as: LEXAPRO Take 10 mg by mouth daily.   multivitamin capsule  Take by mouth.   QUEtiapine 25 MG tablet Commonly known as: SEROQUEL Take 25 mg by mouth 2 (two) times daily.   rosuvastatin 10 MG tablet Commonly known as: CRESTOR Take 10 mg by mouth daily.   Shingrix injection Generic drug: Zoster Vaccine Adjuvanted inject 0.5 milliliter intramuscularly   Vitamin D 50 MCG (2000 UT) tablet Take 2,000 Units by mouth daily.       ROS was provided with assistance of staff.  Review of Systems  Constitutional:  Negative for activity change, appetite change and fever.  HENT:  Negative for congestion, hearing loss and trouble swallowing.   Eyes:  Negative for visual disturbance.  Respiratory:  Negative for shortness of breath.   Cardiovascular:  Negative for leg swelling.  Gastrointestinal:  Negative for abdominal pain and constipation.  Genitourinary:  Negative for dysuria, frequency and urgency.  Musculoskeletal:   Negative for gait problem.  Skin:  Negative for color change.  Neurological:  Negative for speech difficulty, weakness and headaches.       Memory lapses.   Psychiatric/Behavioral:  Positive for agitation, behavioral problems, confusion and sleep disturbance. The patient is nervous/anxious.        Reported the patient's anxious episodes, early am awake, wandering, restlessness, angry spells   Immunization History  Administered Date(s) Administered   Influenza, High Dose Seasonal PF 11/21/2013   PFIZER(Purple Top)SARS-COV-2 Vaccination 02/16/2019, 03/09/2019, 10/26/2019, 06/24/2020   PPD Test 10/23/2005   Pneumococcal Conjugate-13 08/08/2013   Pneumococcal Polysaccharide-23 12/27/2011, 01/25/2018   Tdap 11/28/2009   Zoster, Live 03/15/2005   Pertinent  Health Maintenance Due  Topic Date Due   DEXA SCAN  Never done   INFLUENZA VACCINE  08/25/2020   PNA vac Low Risk Adult  Completed   No flowsheet data found. Functional Status Survey:    Vitals:   07/04/20 1446  BP: (!) 152/76  Pulse: 64  Resp: 18  Temp: 98.6 F (37 C)  SpO2: 97%  Weight: 106 lb 6.4 oz (48.3 kg)  Height: 5\' 5"  (1.651 m)   Body mass index is 17.71 kg/m. Physical Exam Vitals and nursing note reviewed.  Constitutional:      Appearance: Normal appearance.  HENT:     Head: Normocephalic and atraumatic.     Mouth/Throat:     Mouth: Mucous membranes are moist.  Eyes:     Extraocular Movements: Extraocular movements intact.     Conjunctiva/sclera: Conjunctivae normal.     Pupils: Pupils are equal, round, and reactive to light.  Cardiovascular:     Rate and Rhythm: Normal rate and regular rhythm.     Heart sounds: No murmur heard. Pulmonary:     Effort: Pulmonary effort is normal.     Breath sounds: No rales.  Abdominal:     General: Bowel sounds are normal.     Palpations: Abdomen is soft.     Tenderness: There is no abdominal tenderness.  Musculoskeletal:     Cervical back: Normal range of motion  and neck supple.     Right lower leg: No edema.     Left lower leg: No edema.  Skin:    General: Skin is warm and dry.  Neurological:     General: No focal deficit present.     Mental Status: She is alert. Mental status is at baseline.     Gait: Gait normal.     Comments: Oriented to person  Psychiatric:        Mood and Affect: Mood normal.  Comments: Smile, conversing appropriately during my examination.     Labs reviewed: Recent Labs    06/18/20 0000  NA 136*  K 4.1  CL 100  CO2 29*  BUN 12  12  CREATININE 0.8  0.8  CALCIUM 9.3   Recent Labs    06/18/20 0000  AST 23  ALT 18  ALKPHOS 59  ALBUMIN 4.2   Recent Labs    06/18/20 0000  WBC 5.9  NEUTROABS 2,903.00  HGB 15.0  HCT 45  PLT 337   Lab Results  Component Value Date   TSH 1.87 06/18/2020   No results found for: HGBA1C Lab Results  Component Value Date   CHOL 178 06/18/2020   HDL 77 (A) 06/18/2020   LDLCALC 86 06/18/2020   TRIG 61 06/18/2020    Significant Diagnostic Results in last 30 days:  No results found.  Assessment/Plan Depression with anxiety persisted restlessness, angry spells, packing up her belongs and through them out, prn Lorazepam used often with little efficacy. takes Escitalopram 10mg  qd and Clonazepam 0.5mg  id.  On  Seroquel 25mg  bid since 06/26/20. Takes Depakote 125mg  bid. Will increase Depakote to 125mg  tid. Observe.   Alzheimer disease ([place]) Hx of Alzheimer's dementia, takes Donepezil 5mg  qd, no CT/MRI of brain located. F/u [facility] memory care center, MMSE 17/30. May consider adding Memantine. TSH 1.87 06/17/20  Hyperlipidemia takes Rosuvastatin, LDL 86 06/17/20  Vitamin B12 deficiency Vit B12 deficiency, takes Vit B 12 3000mg  qd, Vit B12 level 551 06/17/20  Vitamin D deficiency Vit D deficiency, takes Vit 2000 u daily    Family/ staff Communication: plan of care reviewed with the patient and charge nurse.   Labs/tests ordered:  none  Time spend 35 minutes.

## 2020-07-04 NOTE — Assessment & Plan Note (Signed)
Vit D deficiency, takes Vit 2000 u daily

## 2020-07-04 NOTE — Assessment & Plan Note (Signed)
Hx of Alzheimer's dementia, takes Donepezil 5mg  qd, no CT/MRI of brain located. F/u [facility] memory care center, MMSE 17/30. May consider adding Memantine. TSH 1.87 06/17/20

## 2020-07-04 NOTE — Assessment & Plan Note (Signed)
persisted restlessness, angry spells, packing up her belongs and through them out, prn Lorazepam used often with little efficacy. takes Escitalopram 10mg  qd and Clonazepam 0.5mg  id.  On  Seroquel 25mg  bid since 06/26/20. Takes Depakote 125mg  bid. Will increase Depakote to 125mg  tid. Observe.

## 2020-07-07 ENCOUNTER — Non-Acute Institutional Stay (SKILLED_NURSING_FACILITY): Payer: Medicare PPO | Admitting: Nurse Practitioner

## 2020-07-07 ENCOUNTER — Encounter: Payer: Self-pay | Admitting: Nurse Practitioner

## 2020-07-07 DIAGNOSIS — E559 Vitamin D deficiency, unspecified: Secondary | ICD-10-CM

## 2020-07-07 DIAGNOSIS — E785 Hyperlipidemia, unspecified: Secondary | ICD-10-CM

## 2020-07-07 DIAGNOSIS — M810 Age-related osteoporosis without current pathological fracture: Secondary | ICD-10-CM

## 2020-07-07 DIAGNOSIS — F0280 Dementia in other diseases classified elsewhere without behavioral disturbance: Secondary | ICD-10-CM | POA: Diagnosis not present

## 2020-07-07 DIAGNOSIS — G309 Alzheimer's disease, unspecified: Secondary | ICD-10-CM | POA: Diagnosis not present

## 2020-07-07 DIAGNOSIS — F418 Other specified anxiety disorders: Secondary | ICD-10-CM

## 2020-07-07 DIAGNOSIS — E538 Deficiency of other specified B group vitamins: Secondary | ICD-10-CM | POA: Diagnosis not present

## 2020-07-07 DIAGNOSIS — R41841 Cognitive communication deficit: Secondary | ICD-10-CM | POA: Diagnosis not present

## 2020-07-07 NOTE — Assessment & Plan Note (Signed)
akes Escitalopram 10mg  qd. On  Seroquel 25mg  bid since 06/26/20. Takes Depakote 125mg  tid 07/04/20, and Clonazepam 0.5mg  bid. Persisted restlessness, angry spells, and agitation. 07/07/20 will have prn Haldol available for 14 days, observe.

## 2020-07-07 NOTE — Assessment & Plan Note (Signed)
takes Alendronate(started 2019 after foot fx) needs DEXA

## 2020-07-07 NOTE — Progress Notes (Signed)
Location:   [place] Room Number: [place] of Service:  SNF (31) Provider:  Seichi Kaufhold Otho Darner, NP    Patient Care Team: [name], MD as PCP - General (Internal Medicine)  Extended Emergency Contact Information Primary Emergency Contact: [place] of [place] Phone: [phone] Relation: Spouse Secondary Emergency Contact: Jerm[place], [place] Mobile Phone: [phone] Relation: Other  Code Status:  FULL CODE Goals of care: Advanced Directive information Advanced Directives 07/07/2020  Does Patient Have a Medical Advance Directive? Yes  Type of Paramedic of [place];Living will  Does patient want to make changes to medical advance directive? No - Patient declined  Copy of [place] in Chart? Yes - validated most recent copy scanned in chart (See row information)     Chief Complaint  Patient presents with   Medical Management of Chronic Issues    Routine follow up.    Health Maintenance    Discuss need for hepatits C screening, shingles vaccine, TD/Tdap vaccine, and DEXA scan.     HPI:  Pt is a 75 y.o. female seen today for medical management of chronic diseases.     Hx of Alzheimer's dementia, takes Donepezil 5mg  qd, no CT/MRI of brain located. F/u [facility] memory care center, MMSE 17/30. May consider adding Memantine. TSH 1.875/24/22.              Hyperlipidemia, takes Rosuvastatin, LDL 86 06/17/20             Depression/anxiety, takes Escitalopram 10mg  qd. On  Seroquel 25mg  bid since 06/26/20. Takes Depakote 125mg  tid 07/04/20, and Clonazepam 0.5mg  bid. Persisted restlessness, angry spells, and agitation.              Vit B12 deficiency, takes Vit B 12 3000mg  qd, Vit B12 level 551 06/17/20             Vit D deficiency, takes Vit 2000 u daily             OP, takes Alendronate(started 2019 after foot fx) needs DEXA  Past Medical History:  Diagnosis Date   H/O mammogram 2021   Per [facility]  Patient Packet   Past Surgical History:  Procedure Laterality Date   COLONOSCOPY  2020   Per [facility] New Patient Packet    Allergies  Allergen Reactions   Sertraline Other (See Comments)    Suicidal thoughts   Penicillin G Rash    Welts    Allergies as of 07/07/2020       Reactions   Sertraline Other (See Comments)   Suicidal thoughts   Penicillin G Rash   Welts        Medication List        Accurate as of July 07, 2020  4:38 PM. If you have any questions, ask your nurse or doctor.          STOP taking these medications    Shingrix injection Generic drug: Zoster Vaccine Adjuvanted Stopped by: Nikita Surman X Steffan Caniglia, NP       TAKE these medications    alendronate 70 MG tablet Commonly known as: FOSAMAX Take 70 mg by mouth once a week. Take with a full glass of water on an empty stomach.   clonazePAM 0.5 MG tablet Commonly known as: KLONOPIN Take 1 tablet (0.5 mg total) by mouth 2 (two) times daily.   CYANOCOBALAMIN PO Take 3,000 mcg by mouth daily.   divalproex 125 MG DR tablet  Commonly known as: DEPAKOTE Take 125 mg by mouth 3 (three) times daily.   donepezil 5 MG tablet Commonly known as: ARICEPT   escitalopram 10 MG tablet Commonly known as: LEXAPRO Take 10 mg by mouth daily.   haloperidol 2 MG tablet Commonly known as: HALDOL Take 2 mg by mouth every 4 (four) hours as needed for agitation.   LORazepam 0.5 MG tablet Commonly known as: ATIVAN Take 0.5 mg by mouth every 6 (six) hours as needed for anxiety.   multivitamin capsule Take by mouth.   QUEtiapine 25 MG tablet Commonly known as: SEROQUEL Take 25 mg by mouth 2 (two) times daily.   rosuvastatin 10 MG tablet Commonly known as: CRESTOR Take 10 mg by mouth daily.   Vitamin D 50 MCG (2000 UT) tablet Take 2,000 Units by mouth daily.        Review of Systems  Constitutional:  Negative for activity change, appetite change and fever.  HENT:  Negative for congestion, hearing loss and  trouble swallowing.   Eyes:  Negative for visual disturbance.  Respiratory:  Negative for shortness of breath.   Cardiovascular:  Negative for leg swelling.  Gastrointestinal:  Negative for abdominal pain and constipation.  Genitourinary:  Negative for dysuria, frequency and urgency.  Musculoskeletal:  Negative for gait problem.  Skin:  Negative for color change.  Neurological:  Negative for speech difficulty, weakness and headaches.       Memory lapses.   Psychiatric/Behavioral:  Positive for agitation, behavioral problems, confusion and sleep disturbance. The patient is nervous/anxious.        Reported the patient's anxious episodes, early am awake, wandering, restlessness, angry spells   Immunization History  Administered Date(s) Administered   Influenza, High Dose Seasonal PF 11/21/2013   PFIZER(Purple Top)SARS-COV-2 Vaccination 02/16/2019, 03/09/2019, 10/26/2019, 06/24/2020   PPD Test 10/23/2005   Pneumococcal Conjugate-13 08/08/2013   Pneumococcal Polysaccharide-23 12/27/2011, 01/25/2018   Tdap 11/28/2009   Zoster, Live 03/15/2005   Pertinent  Health Maintenance Due  Topic Date Due   DEXA SCAN  Never done   INFLUENZA VACCINE  08/25/2020   PNA vac Low Risk Adult  Completed   No flowsheet data found. Functional Status Survey:    Vitals:   07/07/20 1509  BP: (!) 152/76  Pulse: 64  Resp: 18  Temp: 97.7 F (36.5 C)  SpO2: 99%  Weight: 106 lb 6.4 oz (48.3 kg)  Height: 5\' 5"  (1.651 m)   Body mass index is 17.71 kg/m. Physical Exam Vitals and nursing note reviewed.  Constitutional:      Appearance: Normal appearance.  HENT:     Head: Normocephalic and atraumatic.     Mouth/Throat:     Mouth: Mucous membranes are moist.  Eyes:     Extraocular Movements: Extraocular movements intact.     Conjunctiva/sclera: Conjunctivae normal.     Pupils: Pupils are equal, round, and reactive to light.  Cardiovascular:     Rate and Rhythm: Normal rate and regular rhythm.      Heart sounds: No murmur heard. Pulmonary:     Effort: Pulmonary effort is normal.     Breath sounds: No rales.  Abdominal:     General: Bowel sounds are normal.     Palpations: Abdomen is soft.     Tenderness: There is no abdominal tenderness.  Musculoskeletal:     Cervical back: Normal range of motion and neck supple.     Right lower leg: No edema.     Left lower leg:  No edema.  Skin:    General: Skin is warm and dry.  Neurological:     General: No focal deficit present.     Mental Status: She is alert. Mental status is at baseline.     Gait: Gait normal.     Comments: Oriented to person  Psychiatric:        Mood and Affect: Mood normal.     Comments: Smile, followed simple directions.     Labs reviewed: Recent Labs    06/18/20 0000  NA 136*  K 4.1  CL 100  CO2 29*  BUN 12  12  CREATININE 0.8  0.8  CALCIUM 9.3   Recent Labs    06/18/20 0000  AST 23  ALT 18  ALKPHOS 59  ALBUMIN 4.2   Recent Labs    06/18/20 0000  WBC 5.9  NEUTROABS 2,903.00  HGB 15.0  HCT 45  PLT 337   Lab Results  Component Value Date   TSH 1.87 06/18/2020   No results found for: HGBA1C Lab Results  Component Value Date   CHOL 178 06/18/2020   HDL 77 (A) 06/18/2020   LDLCALC 86 06/18/2020   TRIG 61 06/18/2020    Significant Diagnostic Results in last 30 days:  No results found.  Assessment/Plan Depression with anxiety akes Escitalopram 10mg  qd. On  Seroquel 25mg  bid since 06/26/20. Takes Depakote 125mg  tid 07/04/20, and Clonazepam 0.5mg  bid. Persisted restlessness, angry spells, and agitation. 07/07/20 will have prn Haldol available for 14 days, observe.   Alzheimer disease ([place]) Hx of Alzheimer's dementia, takes Donepezil 5mg  qd, no CT/MRI of brain located. F/u [facility] memory care center, MMSE 17/30. May consider adding Memantine. TSH 1.875/24/22.   Hyperlipidemia takes Rosuvastatin, LDL 86 06/17/20  Vitamin B12 deficiency Vit B12 deficiency, takes Vit B 12 3000mg  qd,  Vit B12 level 551 06/17/20  Vitamin D deficiency Vit D deficiency, takes Vit 2000 u daily    Family/ staff Communication: plan of care reviewed with the patient and charge nurse.   Labs/tests ordered:  none  Time spend 35 minutes.

## 2020-07-07 NOTE — Assessment & Plan Note (Signed)
Vit B12 deficiency, takes Vit B 12 3000mg  qd, Vit B12 level 551 06/17/20

## 2020-07-07 NOTE — Assessment & Plan Note (Signed)
Hx of Alzheimer's dementia, takes Donepezil 5mg  qd, no CT/MRI of brain located. F/u [facility] memory care center, MMSE 17/30. May consider adding Memantine. TSH 1.875/24/22.

## 2020-07-07 NOTE — Assessment & Plan Note (Signed)
takes Rosuvastatin, LDL 86 06/17/20

## 2020-07-07 NOTE — Assessment & Plan Note (Signed)
Vit D deficiency, takes Vit 2000 u daily

## 2020-07-10 ENCOUNTER — Non-Acute Institutional Stay (SKILLED_NURSING_FACILITY): Payer: Medicare PPO | Admitting: Nurse Practitioner

## 2020-07-10 ENCOUNTER — Encounter: Payer: Self-pay | Admitting: Nurse Practitioner

## 2020-07-10 DIAGNOSIS — E538 Deficiency of other specified B group vitamins: Secondary | ICD-10-CM

## 2020-07-10 DIAGNOSIS — R41841 Cognitive communication deficit: Secondary | ICD-10-CM | POA: Diagnosis not present

## 2020-07-10 DIAGNOSIS — F0280 Dementia in other diseases classified elsewhere without behavioral disturbance: Secondary | ICD-10-CM

## 2020-07-10 DIAGNOSIS — E785 Hyperlipidemia, unspecified: Secondary | ICD-10-CM | POA: Diagnosis not present

## 2020-07-10 DIAGNOSIS — E559 Vitamin D deficiency, unspecified: Secondary | ICD-10-CM | POA: Diagnosis not present

## 2020-07-10 DIAGNOSIS — G309 Alzheimer's disease, unspecified: Secondary | ICD-10-CM

## 2020-07-10 DIAGNOSIS — F418 Other specified anxiety disorders: Secondary | ICD-10-CM

## 2020-07-10 DIAGNOSIS — M810 Age-related osteoporosis without current pathological fracture: Secondary | ICD-10-CM

## 2020-07-10 NOTE — Assessment & Plan Note (Signed)
takes Vit B 12 3000mg  qd, Vit B12 level 551 06/17/20

## 2020-07-10 NOTE — Assessment & Plan Note (Signed)
takes Alendronate(started 2019 after foot fx) needs DEXA

## 2020-07-10 NOTE — Assessment & Plan Note (Signed)
takes Rosuvastatin, LDL 86 06/17/20

## 2020-07-10 NOTE — Assessment & Plan Note (Signed)
x of Alzheimer's dementia, takes Donepezil 5mg  qd, no CT/MRI of brain located. F/u [facility] memory care center, MMSE 17/30. May consider adding Memantine. TSH 1.875/24/22.

## 2020-07-10 NOTE — Assessment & Plan Note (Addendum)
takes Escitalopram 10mg  qd. On  Seroquel 25mg  bid since 06/26/20. Takes Depakote 125mg  tid 07/04/20, and Clonazepam 0.5mg  bid. Persisted restlessness, angry spells, and agitation. added Haldol 2mg  prn since 07/07/20. excessive day time sleep since Haldol 2mg  prn started 07/07/20, effective in behavioral management. Will dc Depakote in setting of the patient's sleepiness and little efficacy in her mood/behavioral management. Observe.  07/14/20 Dr. Lyndel Safe advised to resume Depakote, reduce Haldol.

## 2020-07-10 NOTE — Progress Notes (Addendum)
Location:   [place] Room Number: [place] of Service:  SNF (31) Provider:  Cormac Wint Otho Darner, NP    Patient Care Team: [name], MD as PCP - General (Internal Medicine)  Extended Emergency Contact Information Primary Emergency Contact: [place] Phone: [phone] Relation: Spouse Secondary Emergency Contact: Bhumi, [place] Phone: [phone] Relation: Other  Code Status:  DNR Goals of care: Advanced Directive information Advanced Directives 07/10/2020  Does Patient Have a Medical Advance Directive? Yes  Type of Paramedic of [place];Living will  Does patient want to make changes to medical advance directive? No - Patient declined  Copy of [place] in Chart? Yes - validated most recent copy scanned in chart (See row information)     Chief Complaint  Patient presents with   Acute Visit    Patient complains of sleepiness.     HPI:  Pt is a 75 y.o. female seen today for an acute visit for excessive day time sleep since Haldol 2mg  prn started 07/07/20, effective in behavioral management.  Hx of Alzheimer's dementia, takes Donepezil 5mg  qd, no CT/MRI of brain located. F/u [facility] memory care center, MMSE 17/30. May consider adding Memantine. TSH 1.875/24/22.              Hyperlipidemia, takes Rosuvastatin, LDL 86 06/17/20             Depression/anxiety, takes Escitalopram 10mg  qd. On  Seroquel 25mg  bid since 06/26/20. Takes Depakote 125mg  tid 07/04/20, and Clonazepam 0.5mg  bid. Persisted restlessness, angry spells, and agitation. added Haldol 2mg  prn since 07/07/20             Vit B12 deficiency, takes Vit B 12 3000mg  qd, Vit B12 level 551 06/17/20             Vit D deficiency, takes Vit 2000 u daily             OP, takes Alendronate(started 2019 after foot fx) needs DEXA  Past Medical History:  Diagnosis Date   H/O mammogram 2021   Per [place] New Patient Packet   Past  Surgical History:  Procedure Laterality Date   COLONOSCOPY  2020   Per [facility] New Patient Packet    Allergies  Allergen Reactions   Sertraline Other (See Comments)    Suicidal thoughts   Penicillin G Rash    Welts    Allergies as of 07/10/2020       Reactions   Sertraline Other (See Comments)   Suicidal thoughts   Penicillin G Rash   Welts        Medication List        Accurate as of July 10, 2020 11:59 PM. If you have any questions, ask your nurse or doctor.          STOP taking these medications    divalproex 125 MG DR tablet Commonly known as: DEPAKOTE Stopped by: Bernardette Waldron X Julieth Tugman, NP       TAKE these medications    alendronate 70 MG tablet Commonly known as: FOSAMAX Take 70 mg by mouth once a week. Take with a full glass of water on an empty stomach.   clonazePAM 0.5 MG tablet Commonly known as: KLONOPIN Take 1 tablet (0.5 mg total) by mouth 2 (two) times daily.   CYANOCOBALAMIN PO Take 3,000 mcg by mouth daily.   donepezil 5 MG tablet Commonly known as: ARICEPT   escitalopram 10  MG tablet Commonly known as: LEXAPRO Take 10 mg by mouth daily.   haloperidol 2 MG tablet Commonly known as: HALDOL Take 2 mg by mouth every 4 (four) hours as needed for agitation.   LORazepam 0.5 MG tablet Commonly known as: ATIVAN Take 0.5 mg by mouth every 6 (six) hours as needed for anxiety.   multivitamin capsule Take by mouth.   QUEtiapine 25 MG tablet Commonly known as: SEROQUEL Take 25 mg by mouth 2 (two) times daily.   rosuvastatin 10 MG tablet Commonly known as: CRESTOR Take 10 mg by mouth daily.   Vitamin D 50 MCG (2000 UT) tablet Take 2,000 Units by mouth daily.        Review of Systems  Constitutional:  Negative for activity change, appetite change and fever.       Day time spleeiness  HENT:  Negative for congestion, hearing loss and trouble swallowing.   Eyes:  Negative for visual disturbance.  Respiratory:  Negative for shortness of  breath.   Cardiovascular:  Negative for leg swelling.  Gastrointestinal:  Negative for abdominal pain and constipation.  Genitourinary:  Negative for dysuria, frequency and urgency.  Musculoskeletal:  Negative for gait problem.  Skin:  Negative for color change.  Neurological:  Negative for speech difficulty, weakness and headaches.       Memory lapses.   Psychiatric/Behavioral:  Positive for agitation, behavioral problems, confusion and sleep disturbance. The patient is nervous/anxious.        Reported the patient's anxious episodes, early am awake, wandering, restlessness, angry spells, stabilizing since Haldol prn started.    Immunization History  Administered Date(s) Administered   Influenza, High Dose Seasonal PF 11/21/2013   PFIZER(Purple Top)SARS-COV-2 Vaccination 02/16/2019, 03/09/2019, 10/26/2019, 06/24/2020   PPD Test 10/23/2005   Pneumococcal Conjugate-13 08/08/2013   Pneumococcal Polysaccharide-23 12/27/2011, 01/25/2018   Tdap 11/28/2009   Zoster, Live 03/15/2005   Pertinent  Health Maintenance Due  Topic Date Due   DEXA SCAN  Never done   INFLUENZA VACCINE  08/25/2020   PNA vac Low Risk Adult  Completed   No flowsheet data found. Functional Status Survey:    Vitals:   07/10/20 1129  BP: 126/72  Pulse: 78  Resp: 18  Temp: 98 F (36.7 C)  SpO2: 97%  Weight: 106 lb 12.8 oz (48.4 kg)  Height: 5\' 5"  (1.651 m)   Body mass index is 17.77 kg/m. Physical Exam Vitals and nursing note reviewed.  Constitutional:      Appearance: Normal appearance.  HENT:     Head: Normocephalic and atraumatic.     Mouth/Throat:     Mouth: Mucous membranes are moist.  Eyes:     Extraocular Movements: Extraocular movements intact.     Conjunctiva/sclera: Conjunctivae normal.     Pupils: Pupils are equal, round, and reactive to light.  Cardiovascular:     Rate and Rhythm: Normal rate and regular rhythm.     Heart sounds: No murmur heard. Pulmonary:     Effort: Pulmonary  effort is normal.     Breath sounds: No rales.  Abdominal:     General: Bowel sounds are normal.     Palpations: Abdomen is soft.     Tenderness: no abdominal tenderness  Musculoskeletal:     Cervical back: Normal range of motion and neck supple.     Right lower leg: No edema.     Left lower leg: No edema.  Skin:    General: Skin is warm and dry.  Neurological:  General: No focal deficit present.     Mental Status: She is alert. Mental status is at baseline.     Gait: Gait normal.     Comments: Oriented to person  Psychiatric:        Mood and Affect: Mood normal.     Comments: Smile, followed simple directions.     Labs reviewed: Recent Labs    06/18/20 0000  NA 136*  K 4.1  CL 100  CO2 29*  BUN 12  12  CREATININE 0.8  0.8  CALCIUM 9.3   Recent Labs    06/18/20 0000  AST 23  ALT 18  ALKPHOS 59  ALBUMIN 4.2   Recent Labs    06/18/20 0000  WBC 5.9  NEUTROABS 2,903.00  HGB 15.0  HCT 45  PLT 337   Lab Results  Component Value Date   TSH 1.87 06/18/2020   No results found for: HGBA1C Lab Results  Component Value Date   CHOL 178 06/18/2020   HDL 77 (A) 06/18/2020   LDLCALC 86 06/18/2020   TRIG 61 06/18/2020    Significant Diagnostic Results in last 30 days:  No results found.  Assessment/Plan Depression with anxiety takes Escitalopram 10mg  qd. On  Seroquel 25mg  bid since 06/26/20. Takes Depakote 125mg  tid 07/04/20, and Clonazepam 0.5mg  bid. Persisted restlessness, angry spells, and agitation. added Haldol 2mg  prn since 07/07/20. excessive day time sleep since Haldol 2mg  prn started 07/07/20, effective in behavioral management. Will dc Depakote in setting of the patient's sleepiness and little efficacy in her mood/behavioral management. Observe.  07/14/20 Dr. Lyndel Safe advised to resume Depakote, reduce Haldol.   Alzheimer disease ([place]) x of Alzheimer's dementia, takes Donepezil 5mg  qd, no CT/MRI of brain located. F/u [facility] memory care center, MMSE  17/30. May consider adding Memantine. TSH 1.875/24/22.   Hyperlipidemia takes Rosuvastatin, LDL 86 06/17/20  Vitamin B12 deficiency takes Vit B 12 3000mg  qd, Vit B12 level 551 06/17/20  Vitamin D deficiency  takes Vit 2000 u daily  Osteoporosis takes Alendronate(started 2019 after foot fx) needs DEXA    Family/ staff Communication: plan of care reviewed with the patient and charge nurse.   Labs/tests ordered:  none  Time spend 35 minutes.

## 2020-07-10 NOTE — Assessment & Plan Note (Signed)
takes Vit 2000 u daily

## 2020-07-11 ENCOUNTER — Encounter: Payer: Self-pay | Admitting: Nurse Practitioner

## 2020-07-15 DIAGNOSIS — F0281 Dementia in other diseases classified elsewhere with behavioral disturbance: Secondary | ICD-10-CM | POA: Diagnosis not present

## 2020-07-15 DIAGNOSIS — R634 Abnormal weight loss: Secondary | ICD-10-CM | POA: Diagnosis not present

## 2020-07-15 DIAGNOSIS — I10 Essential (primary) hypertension: Secondary | ICD-10-CM | POA: Diagnosis not present

## 2020-07-16 ENCOUNTER — Other Ambulatory Visit: Payer: Self-pay | Admitting: *Deleted

## 2020-07-16 ENCOUNTER — Telehealth: Payer: Self-pay | Admitting: Internal Medicine

## 2020-07-16 DIAGNOSIS — R41841 Cognitive communication deficit: Secondary | ICD-10-CM | POA: Diagnosis not present

## 2020-07-16 LAB — BASIC METABOLIC PANEL
BUN: 14 (ref 4–21)
CO2: 30 — AB (ref 13–22)
Chloride: 98 — AB (ref 99–108)
Creatinine: 0.7 (ref 0.5–1.1)
Glucose: 73
Potassium: 4.5 (ref 3.4–5.3)
Sodium: 136 — AB (ref 137–147)

## 2020-07-16 LAB — CBC AND DIFFERENTIAL
HCT: 46 (ref 36–46)
Hemoglobin: 15 (ref 12.0–16.0)
Neutrophils Absolute: 3762
Platelets: 388 (ref 150–399)
WBC: 7.6

## 2020-07-16 LAB — COMPREHENSIVE METABOLIC PANEL
Albumin: 4.3 (ref 3.5–5.0)
Calcium: 9.6 (ref 8.7–10.7)
Globulin: 2.4

## 2020-07-16 LAB — HEPATIC FUNCTION PANEL
ALT: 26 (ref 7–35)
AST: 29 (ref 13–35)
Alkaline Phosphatase: 69 (ref 25–125)
Bilirubin, Total: 0.5

## 2020-07-16 LAB — CBC: RBC: 4.77 (ref 3.87–5.11)

## 2020-07-16 MED ORDER — CLONAZEPAM 0.5 MG PO TABS: 0.5000 mg | ORAL_TABLET | Freq: Two times a day (BID) | ORAL | 0 refills | Status: DC

## 2020-07-16 NOTE — Telephone Encounter (Signed)
[facility] requested refill Pended Rx and sent to [facility] for approval.

## 2020-07-16 NOTE — Telephone Encounter (Signed)
ERROR

## 2020-07-22 DIAGNOSIS — R41841 Cognitive communication deficit: Secondary | ICD-10-CM | POA: Diagnosis not present

## 2020-07-25 ENCOUNTER — Encounter: Payer: Self-pay | Admitting: Nurse Practitioner

## 2020-07-25 ENCOUNTER — Non-Acute Institutional Stay (SKILLED_NURSING_FACILITY): Payer: Medicare PPO | Admitting: Nurse Practitioner

## 2020-07-25 DIAGNOSIS — F0280 Dementia in other diseases classified elsewhere without behavioral disturbance: Secondary | ICD-10-CM | POA: Diagnosis not present

## 2020-07-25 DIAGNOSIS — G309 Alzheimer's disease, unspecified: Secondary | ICD-10-CM

## 2020-07-25 DIAGNOSIS — E538 Deficiency of other specified B group vitamins: Secondary | ICD-10-CM

## 2020-07-25 DIAGNOSIS — M810 Age-related osteoporosis without current pathological fracture: Secondary | ICD-10-CM

## 2020-07-25 DIAGNOSIS — E785 Hyperlipidemia, unspecified: Secondary | ICD-10-CM

## 2020-07-25 DIAGNOSIS — E559 Vitamin D deficiency, unspecified: Secondary | ICD-10-CM

## 2020-07-25 DIAGNOSIS — F418 Other specified anxiety disorders: Secondary | ICD-10-CM

## 2020-07-25 NOTE — Assessment & Plan Note (Signed)
Alzheimer's dementia, takes Donepezil 5mg  qd, no CT/MRI of brain located. F/u [facility] memory care center, MMSE 17/30. May consider adding Memantine. TSH 1.875/24/22.

## 2020-07-25 NOTE — Assessment & Plan Note (Signed)
Vit B12 deficiency, takes Vit B 12 3000mg  qd, Vit B12 level 551 06/17/20

## 2020-07-25 NOTE — Assessment & Plan Note (Signed)
Vit D deficiency, takes Vit 2000 u daily

## 2020-07-25 NOTE — Progress Notes (Signed)
Location:   SNF [place] Room Number: Z610 Place of Service:  SNF (31) Provider: [facility] Lai Hendriks NP  [name], MD  Patient Care Team: [name], MD as PCP - General (Internal Medicine)  Extended Emergency Contact Information Primary Emergency Contact: [place] Phone: [phone] Relation: Spouse Secondary Emergency Contact: [place], [place] Mobile Phone: [phone] Relation: Other  Code Status: DNR Goals of care: Advanced Directive information Advanced Directives 07/25/2020  Does Patient Have a Medical Advance Directive? Yes  Type of Paramedic of [place];Living will  Does patient want to make changes to medical advance directive? No - Patient declined  Copy of [place] in Chart? Yes - validated most recent copy scanned in chart (See row information)     Chief Complaint  Patient presents with   Acute Visit    Patient presents for reevaluation for haldol use     HPI:  Pt is a 75 y.o. female seen today for an acute visit for Haldol 1mg  q8hr prn used almost daily in the past 14 days to stabilize her mood, effective most of times.    Alzheimer's dementia, takes Donepezil 5mg  qd, no CT/MRI of brain located. F/u [facility] memory care center, MMSE 17/30. May consider adding Memantine. TSH 1.875/24/22.              Hyperlipidemia, takes Rosuvastatin, LDL 86 06/17/20             Depression/anxiety, takes Escitalopram 10mg  qd, Seroquel 25mg  bid since 06/26/20, Depakote 125mg  tid 07/04/20, and Clonazepam 0.5mg  bid. Persisted restlessness, angry spells, and agitation. added Haldol 1mg  prn              Vit B12 deficiency, takes Vit B 12 3000mg  qd, Vit B12 level 551 06/17/20             Vit D deficiency, takes Vit 2000 u daily             OP, takes Alendronate(started 2019 after foot fx) needs DEXA    Past Medical History:  Diagnosis Date   H/O mammogram 2021   Per [place] New Patient Packet    Past Surgical History:  Procedure Laterality Date   COLONOSCOPY  2020   Per [facility] New Patient Packet    Allergies  Allergen Reactions   Sertraline Other (See Comments)    Suicidal thoughts   Penicillin G Rash    Welts    Allergies as of 07/25/2020       Reactions   Sertraline Other (See Comments)   Suicidal thoughts   Penicillin G Rash   Welts        Medication List        Accurate as of July 25, 2020 11:59 PM. If you have any questions, ask your nurse or doctor.          alendronate 70 MG tablet Commonly known as: FOSAMAX Take 70 mg by mouth once a week. Take with a full glass of water on an empty stomach.   clonazePAM 0.5 MG tablet Commonly known as: KLONOPIN Take 1 tablet (0.5 mg total) by mouth 2 (two) times daily.   CYANOCOBALAMIN PO Take 3,000 mcg by mouth daily.   divalproex 125 MG DR tablet Commonly known as: DEPAKOTE Take 125 mg by mouth 3 (three) times daily.   donepezil 5 MG tablet Commonly known as: ARICEPT   escitalopram 10 MG tablet Commonly known as: LEXAPRO Take 10 mg  by mouth daily.   haloperidol 2 MG tablet Commonly known as: HALDOL Take 2 mg by mouth every 4 (four) hours as needed for agitation.   LORazepam 0.5 MG tablet Commonly known as: ATIVAN Take 0.5 mg by mouth every 6 (six) hours as needed for anxiety.   multivitamin capsule Take by mouth.   QUEtiapine 25 MG tablet Commonly known as: SEROQUEL Take 25 mg by mouth 2 (two) times daily.   rosuvastatin 10 MG tablet Commonly known as: CRESTOR Take 10 mg by mouth daily.   Vitamin D 50 MCG (2000 UT) tablet Take 2,000 Units by mouth daily.        Review of Systems  Constitutional:  Negative for activity change, appetite change and fever.  HENT:  Negative for congestion, hearing loss and trouble swallowing.   Eyes:  Negative for visual disturbance.  Respiratory:  Negative for shortness of breath.   Cardiovascular:  Negative for leg swelling.  Gastrointestinal:   Negative for abdominal pain and constipation.  Genitourinary:  Negative for dysuria, frequency and urgency.  Musculoskeletal:  Negative for gait problem.  Skin:  Negative for color change.  Neurological:  Negative for speech difficulty, weakness and headaches.       Memory lapses.   Psychiatric/Behavioral:  Positive for agitation, behavioral problems, confusion and sleep disturbance. The patient is nervous/anxious.        Reported the patient's anxious episodes, early am awake, wandering, restlessness, angry spells, stabilizing since Haldol prn started.    Immunization History  Administered Date(s) Administered   Influenza, High Dose Seasonal PF 11/21/2013   PFIZER(Purple Top)SARS-COV-2 Vaccination 02/16/2019, 03/09/2019, 10/26/2019, 06/24/2020   PPD Test 10/23/2005   Pneumococcal Conjugate-13 08/08/2013   Pneumococcal Polysaccharide-23 12/27/2011, 01/25/2018   Tdap 11/28/2009   Zoster, Live 03/15/2005   Pertinent  Health Maintenance Due  Topic Date Due   DEXA SCAN  Never done   INFLUENZA VACCINE  08/25/2020   PNA vac Low Risk Adult  Completed   No flowsheet data found. Functional Status Survey:    Vitals:   07/25/20 1543  BP: 122/66  Pulse: (!) 58  Resp: 16  Temp: 98 F (36.7 C)  SpO2: 96%  Weight: 108 lb 12.8 oz (49.4 kg)  Height: 5\' 5"  (1.651 m)   Body mass index is 18.11 kg/m. Physical Exam Vitals and nursing note reviewed.  Constitutional:      Appearance: Normal appearance.  HENT:     Head: Normocephalic and atraumatic.     Mouth/Throat:     Mouth: Mucous membranes are moist.  Eyes:     Extraocular Movements: Extraocular movements intact.     Conjunctiva/sclera: Conjunctivae normal.     Pupils: Pupils are equal, round, and reactive to light.  Cardiovascular:     Rate and Rhythm: Normal rate and regular rhythm.     Heart sounds: No murmur heard. Pulmonary:     Effort: Pulmonary effort is normal.     Breath sounds: No rales.  Abdominal:     General:  Bowel sounds are normal.     Palpations: Abdomen is soft.     Tenderness: There is no abdominal tenderness.  Musculoskeletal:     Cervical back: Normal range of motion and neck supple.     Right lower leg: No edema.     Left lower leg: No edema.  Skin:    General: Skin is warm and dry.  Neurological:     General: No focal deficit present.     Mental Status:  She is alert. Mental status is at baseline.     Gait: Gait normal.     Comments: Oriented to person  Psychiatric:        Mood and Affect: Mood normal.     Comments: Smile, followed simple directions.     Labs reviewed: Recent Labs    06/18/20 0000  NA 136*  K 4.1  CL 100  CO2 29*  BUN 12  12  CREATININE 0.8  0.8  CALCIUM 9.3   Recent Labs    06/18/20 0000  AST 23  ALT 18  ALKPHOS 59  ALBUMIN 4.2   Recent Labs    06/18/20 0000  WBC 5.9  NEUTROABS 2,903.00  HGB 15.0  HCT 45  PLT 337   Lab Results  Component Value Date   TSH 1.87 06/18/2020   No results found for: HGBA1C Lab Results  Component Value Date   CHOL 178 06/18/2020   HDL 77 (A) 06/18/2020   LDLCALC 86 06/18/2020   TRIG 61 06/18/2020    Significant Diagnostic Results in last 30 days:  No results found.  Assessment/Plan: Depression with anxiety Depression/anxiety, takes Escitalopram 10mg  qd, Seroquel 25mg  bid since 06/26/20, Depakote 125mg  tid 07/04/20, and Clonazepam 0.5mg  bid. Persisted restlessness, angry spells, and agitation. Haldol 1mg  q8hr prn used almost daily in the past 14 days to stabilize her mood, effective most of times. Will continue prn Haldol x 14 days. Observe.   Alzheimer disease ([place]) Alzheimer's dementia, takes Donepezil 5mg  qd, no CT/MRI of brain located. F/u [facility] memory care center, MMSE 17/30. May consider adding Memantine. TSH 1.875/24/22.  Hyperlipidemia takes Rosuvastatin, LDL 86 06/17/20  Vitamin B12 deficiency Vit B12 deficiency, takes Vit B 12 3000mg  qd, Vit B12 level 551 06/17/20  Vitamin D  deficiency  Vit D deficiency, takes Vit 2000 u daily  Osteoporosis OP, takes Alendronate(started 2019 after foot fx) needs DEXA    Family/ staff Communication: plan of care reviewed with the patient and charge nurse.   Labs/tests ordered:  none  Time spend 25 minutes.

## 2020-07-25 NOTE — Assessment & Plan Note (Signed)
Depression/anxiety, takes Escitalopram 10mg  qd, Seroquel 25mg  bid since 06/26/20, Depakote 125mg  tid 07/04/20, and Clonazepam 0.5mg  bid. Persisted restlessness, angry spells, and agitation. Haldol 1mg  q8hr prn used almost daily in the past 14 days to stabilize her mood, effective most of times. Will continue prn Haldol x 14 days. Observe.

## 2020-07-25 NOTE — Assessment & Plan Note (Signed)
takes Rosuvastatin, LDL 86 06/17/20

## 2020-07-25 NOTE — Assessment & Plan Note (Signed)
OP, takes Alendronate(started 2019 after foot fx) needs DEXA

## 2020-07-29 DIAGNOSIS — R41841 Cognitive communication deficit: Secondary | ICD-10-CM | POA: Diagnosis not present

## 2020-07-31 ENCOUNTER — Encounter: Payer: Self-pay | Admitting: Nurse Practitioner

## 2020-08-04 ENCOUNTER — Encounter: Payer: Self-pay | Admitting: Internal Medicine

## 2020-08-05 ENCOUNTER — Encounter: Payer: Self-pay | Admitting: Internal Medicine

## 2020-08-05 ENCOUNTER — Non-Acute Institutional Stay (SKILLED_NURSING_FACILITY): Payer: Medicare PPO | Admitting: Internal Medicine

## 2020-08-05 DIAGNOSIS — E785 Hyperlipidemia, unspecified: Secondary | ICD-10-CM | POA: Diagnosis not present

## 2020-08-05 DIAGNOSIS — E538 Deficiency of other specified B group vitamins: Secondary | ICD-10-CM | POA: Diagnosis not present

## 2020-08-05 DIAGNOSIS — F418 Other specified anxiety disorders: Secondary | ICD-10-CM | POA: Diagnosis not present

## 2020-08-05 DIAGNOSIS — F0281 Dementia in other diseases classified elsewhere with behavioral disturbance: Secondary | ICD-10-CM

## 2020-08-05 DIAGNOSIS — M810 Age-related osteoporosis without current pathological fracture: Secondary | ICD-10-CM | POA: Diagnosis not present

## 2020-08-05 DIAGNOSIS — F02818 Dementia in other diseases classified elsewhere, unspecified severity, with other behavioral disturbance: Secondary | ICD-10-CM

## 2020-08-05 DIAGNOSIS — E559 Vitamin D deficiency, unspecified: Secondary | ICD-10-CM | POA: Diagnosis not present

## 2020-08-05 DIAGNOSIS — G309 Alzheimer's disease, unspecified: Secondary | ICD-10-CM | POA: Diagnosis not present

## 2020-08-05 NOTE — Progress Notes (Signed)
Location:   [place] Room Number: 110 Place of Service:  SNF [id number]) Provider:  Veleta Miners MD  [name], MD  Patient Care Team: [name], MD as PCP - General (Internal Medicine)  Extended Emergency Contact Information Primary Emergency Contact: Pollyann [place] of [place] Phone: [phone] Relation: Spouse Secondary Emergency Contact: Chrissi, [place] Phone: [phone] Relation: Other  Code Status:  Full Code Managed Care Goals of care: Advanced Directive information Advanced Directives 08/05/2020  Does Patient Have a Medical Advance Directive? Yes  Type of Paramedic of [place];Living will  Does patient want to make changes to medical advance directive? No - Patient declined  Copy of [place] in Chart? Yes - validated most recent copy scanned in chart (See row information)     Chief Complaint  Patient presents with   Acute Visit    Meeting with Husband     HPI:  Pt is a 75 y.o. female seen today for an acute visit to Address Family Concern  Patient is a new Admit to memory unit for safety Patient has a history of Alzheimer's disease,, osteoporosis, vitamin D and B12 deficiency, hyperlipidemia, anxiety  Patient has just moved to [facility] 10 months ago with her husband.  Per her husband there has been on a steep decline in her dementia. Before the admission patient Eloped her apartment and was found by the facility staff.  Her husband decided to move her to memory unit   Since she has been here she had been trying to Elope the facility Started on Seroquel, Depakote and Klonopin  Behaviors much better. Still sometimes they have to use Haldol PRN. But per Nurses she is much more Calmer now Is able to go out with her husband after lunch for few hours. He also comes and visit her in the evening time He had number of Questions about the Meds and her Prognosis  overall Also he thinks she goes to bed very early around 7 pm Patient did not have any concerns She did look Tired and sleepy Her weight is stable. No Falls Past Medical History:  Diagnosis Date   H/O mammogram 2021   Per [place] New Patient Packet   Past Surgical History:  Procedure Laterality Date   COLONOSCOPY  2020   Per [facility] New Patient Packet    Allergies  Allergen Reactions   Sertraline Other (See Comments)    Suicidal thoughts   Penicillin G Rash    Welts    Allergies as of 08/05/2020       Reactions   Sertraline Other (See Comments)   Suicidal thoughts   Penicillin G Rash   Welts        Medication List        Accurate as of August 05, 2020 11:10 AM. If you have any questions, ask your nurse or doctor.          STOP taking these medications    LORazepam 0.5 MG tablet Commonly known as: ATIVAN Stopped by: [name], MD       TAKE these medications    alendronate 70 MG tablet Commonly known as: FOSAMAX Take 70 mg by mouth once a week. Take with a full glass of water on an empty stomach.   clonazePAM 0.5 MG tablet Commonly known as: KLONOPIN Take 1 tablet (0.5 mg total) by mouth 2 (two) times daily.   CYANOCOBALAMIN PO Take 3,000 mcg  by mouth daily.   divalproex 125 MG DR tablet Commonly known as: DEPAKOTE Take 125 mg by mouth 3 (three) times daily.   donepezil 5 MG tablet Commonly known as: ARICEPT   escitalopram 10 MG tablet Commonly known as: LEXAPRO Take 10 mg by mouth daily.   haloperidol 1 MG tablet Commonly known as: HALDOL Take 1 mg by mouth every 8 (eight) hours as needed for agitation.   multivitamin capsule Take by mouth.   QUEtiapine 25 MG tablet Commonly known as: SEROQUEL Take 25 mg by mouth 2 (two) times daily.   rosuvastatin 10 MG tablet Commonly known as: CRESTOR Take 10 mg by mouth daily.   Vitamin D 50 MCG (2000 UT) tablet Take 2,000 Units by mouth daily.        Review of Systems  Constitutional:  Negative.   HENT: Negative.    Respiratory: Negative.    Cardiovascular: Negative.   Gastrointestinal: Negative.   Genitourinary: Negative.   Musculoskeletal: Negative.   Skin: Negative.   Neurological: Negative.   Psychiatric/Behavioral:  Positive for confusion, dysphoric mood and sleep disturbance. The patient is nervous/anxious.   Per husband   Immunization History  Administered Date(s) Administered   Influenza, High Dose Seasonal PF 11/21/2013   PFIZER(Purple Top)SARS-COV-2 Vaccination 02/16/2019, 03/09/2019, 10/26/2019, 06/24/2020   PPD Test 10/23/2005   Pneumococcal Conjugate-13 08/08/2013   Pneumococcal Polysaccharide-23 12/27/2011, 01/25/2018   Tdap 11/28/2009   Zoster, Live 03/15/2005   Pertinent  Health Maintenance Due  Topic Date Due   DEXA SCAN  Never done   INFLUENZA VACCINE  08/25/2020   PNA vac Low Risk Adult  Completed   No flowsheet data found. Functional Status Survey:    Vitals:   08/05/20 1052  BP: 138/68  Pulse: 62  Resp: 16  Temp: 98.2 F (36.8 C)  SpO2: 96%  Weight: 108 lb 12.8 oz (49.4 kg)  Height: 5\' 5"  (1.651 m)   Body mass index is 18.11 kg/m. Physical Exam Constitutional:  Well-developed and well-nourished.  HENT:  Head: Normocephalic.  Mouth/Throat: Oropharynx is clear and moist.  Eyes: Pupils are equal, round, and reactive to light.  Neck: Neck supple.  Cardiovascular: Normal rate and normal heart sounds.  No murmur heard. Pulmonary/Chest: Effort normal and breath sounds normal. No respiratory distress. No wheezes. She has no rales.  Abdominal: Soft. Bowel sounds are normal. No distension. There is no tenderness. There is no rebound.  Musculoskeletal: No edema.  Lymphadenopathy: none Neurological: No Focal Deficits Gait is stable Does have Aphasia Skin: Skin is warm and dry.  Psychiatric: Normal mood and affect. Behavior is normal. Thought content normal.   Labs reviewed: Recent Labs    06/18/20 0000 07/16/20 0000  NA  136* 136*  K 4.1 4.5  CL 100 98*  CO2 29* 30*  BUN 12  12 14   CREATININE 0.8  0.8 0.7  CALCIUM 9.3 9.6   Recent Labs    06/18/20 0000 07/16/20 0000  AST 23 29  ALT 18 26  ALKPHOS 59 69  ALBUMIN 4.2 4.3   Recent Labs    06/18/20 0000 07/16/20 0000  WBC 5.9 7.6  NEUTROABS 2,903.00 3,762.00  HGB 15.0 15.0  HCT 45 46  PLT 337 388   Lab Results  Component Value Date   TSH 1.87 06/18/2020   No results found for: HGBA1C Lab Results  Component Value Date   CHOL 178 06/18/2020   HDL 77 (A) 06/18/2020   LDLCALC 86 06/18/2020   TRIG 61  06/18/2020    Significant Diagnostic Results in last 30 days:  No results found.  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 Hyperlipidemia, unspecified hyperlipidemia type On Statin Osteoporosis, unspecified osteoporosis type, unspecified pathological fracture presence Continue Fosamax Will Order her DEXA around in Jan 2023 when she is due for her Mammogrm so she can go there together Vitamin D deficiency Continue supplement  Vitamin B12 deficiency Continue Supplement   Discussed above in detail with her husband  Family/ staff Communication:  Total time spent in this patient care encounter was  _45  minutes; greater than 50% of the visit spent counseling patient and staff, reviewing records , Labs and coordinating care for problems addressed at this encounter.    Labs/tests ordered:

## 2020-08-06 DIAGNOSIS — R41841 Cognitive communication deficit: Secondary | ICD-10-CM | POA: Diagnosis not present

## 2020-08-07 DIAGNOSIS — F419 Anxiety disorder, unspecified: Secondary | ICD-10-CM | POA: Diagnosis not present

## 2020-08-07 DIAGNOSIS — F0390 Unspecified dementia without behavioral disturbance: Secondary | ICD-10-CM | POA: Diagnosis not present

## 2020-08-08 ENCOUNTER — Non-Acute Institutional Stay (SKILLED_NURSING_FACILITY): Payer: Medicare PPO | Admitting: Nurse Practitioner

## 2020-08-08 ENCOUNTER — Encounter: Payer: Self-pay | Admitting: Nurse Practitioner

## 2020-08-08 DIAGNOSIS — F418 Other specified anxiety disorders: Secondary | ICD-10-CM | POA: Diagnosis not present

## 2020-08-08 DIAGNOSIS — F0280 Dementia in other diseases classified elsewhere without behavioral disturbance: Secondary | ICD-10-CM | POA: Diagnosis not present

## 2020-08-08 DIAGNOSIS — M810 Age-related osteoporosis without current pathological fracture: Secondary | ICD-10-CM | POA: Diagnosis not present

## 2020-08-08 DIAGNOSIS — G309 Alzheimer's disease, unspecified: Secondary | ICD-10-CM

## 2020-08-08 DIAGNOSIS — E785 Hyperlipidemia, unspecified: Secondary | ICD-10-CM

## 2020-08-08 DIAGNOSIS — E538 Deficiency of other specified B group vitamins: Secondary | ICD-10-CM

## 2020-08-08 DIAGNOSIS — E559 Vitamin D deficiency, unspecified: Secondary | ICD-10-CM | POA: Diagnosis not present

## 2020-08-08 NOTE — Assessment & Plan Note (Signed)
Alzheimer's dementia, takes Donepezil 5mg  qd, no CT/MRI of brain located. F/u [facility] memory care center, MMSE 17/30. Adding Memantine. TSH 1.875/24/22

## 2020-08-08 NOTE — Assessment & Plan Note (Signed)
Vit B12 deficiency, takes Vit B 12 3000mg  qd, Vit B12 level 551 06/17/20

## 2020-08-08 NOTE — Assessment & Plan Note (Addendum)
Depression/anxiety, takes Escitalopram Seroquel, Depakote, Clonazepam, prn Haldo has been effective for restlessness, angry spells, and agitation-continue for 2 weeks.

## 2020-08-08 NOTE — Assessment & Plan Note (Signed)
takes Rosuvastatin, LDL 86 06/17/20

## 2020-08-08 NOTE — Assessment & Plan Note (Signed)
Vit D deficiency, takes Vit 2000 u daily

## 2020-08-08 NOTE — Assessment & Plan Note (Signed)
OP, takes Alendronate(started 2019 after foot fx) needs DEXA

## 2020-08-08 NOTE — Progress Notes (Signed)
Location:   [place] Room Number: [place] of Service:  SNF (31) Provider:  Jeral Zick Otho Darner, NP    Patient Care Team: [name], MD as PCP - General (Internal Medicine)  Extended Emergency Contact Information Primary Emergency Contact: [place] of [place] Phone: [phone] Relation: Spouse Secondary Emergency Contact: [place], [place] Phone: [phone] Relation: Other  Code Status:  FULL CODE Goals of care: Advanced Directive information Advanced Directives 08/08/2020  Does Patient Have a Medical Advance Directive? Yes  Type of Paramedic of [place];Living will  Does patient want to make changes to medical advance directive? No - Patient declined  Copy of [place] in Chart? Yes - validated most recent copy scanned in chart (See row information)     Chief Complaint  Patient presents with   Medical Management of Chronic Issues    Routine follow up   Health Maintenance    Discuss need for hepatitis c screening, shingles vaccine, td/tdap vaccine, and DEXA scan.     HPI:  Pt is a 75 y.o. female seen today for medical management of chronic diseases.     Alzheimer's dementia, takes Donepezil 5mg  qd, no CT/MRI of brain located. F/u [facility] memory care center, MMSE 17/30. Adding Memantine. TSH 1.875/24/22.              Hyperlipidemia, takes Rosuvastatin, LDL 86 06/17/20             Depression/anxiety, takes Escitalopram Seroquel, Depakote, Clonazepam, prn Haldol  has been effective for restlessness, angry spells, and agitation.              Vit B12 deficiency, takes Vit B 12 3000mg  qd, Vit B12 level 551 06/17/20             Vit D deficiency, takes Vit 2000 u daily             OP, takes Alendronate(started 2019 after foot fx) needs DEXA    Past Medical History:  Diagnosis Date   H/O mammogram 2021   Per [place] New Patient Packet   Past Surgical History:  Procedure  Laterality Date   COLONOSCOPY  2020   Per [facility] New Patient Packet    Allergies  Allergen Reactions   Sertraline Other (See Comments)    Suicidal thoughts   Penicillin G Rash    Welts    Allergies as of 08/08/2020       Reactions   Sertraline Other (See Comments)   Suicidal thoughts   Penicillin G Rash   Welts        Medication List        Accurate as of August 08, 2020  3:43 PM. If you have any questions, ask your nurse or doctor.          alendronate 70 MG tablet Commonly known as: FOSAMAX Take 70 mg by mouth once a week. Take with a full glass of water on an empty stomach.   clonazePAM 0.5 MG tablet Commonly known as: KLONOPIN Take 1 tablet (0.5 mg total) by mouth 2 (two) times daily.   CYANOCOBALAMIN PO Take 3,000 mcg by mouth daily.   divalproex 125 MG DR tablet Commonly known as: DEPAKOTE Take 125 mg by mouth 3 (three) times daily.   donepezil 5 MG tablet Commonly known as: ARICEPT   escitalopram 10 MG tablet Commonly known as: LEXAPRO Take 10 mg by mouth daily.   haloperidol 1  MG tablet Commonly known as: HALDOL Take 1 mg by mouth every 8 (eight) hours as needed for agitation.   memantine 5 MG tablet Commonly known as: NAMENDA Take 5 mg by mouth 2 (two) times daily.   multivitamin capsule Take by mouth.   QUEtiapine 25 MG tablet Commonly known as: SEROQUEL Take 25 mg by mouth 2 (two) times daily.   rosuvastatin 10 MG tablet Commonly known as: CRESTOR Take 10 mg by mouth daily.   Vitamin D 50 MCG (2000 UT) tablet Take 2,000 Units by mouth daily.        Review of Systems  Constitutional:  Negative for fatigue, fever and unexpected weight change.  HENT:  Negative for congestion, hearing loss and trouble swallowing.   Eyes:  Negative for visual disturbance.  Respiratory:  Negative for shortness of breath.   Cardiovascular:  Negative for leg swelling.  Gastrointestinal:  Negative for abdominal pain and constipation.  Genitourinary:   Negative for dysuria, frequency and urgency.  Musculoskeletal:  Negative for gait problem.  Skin:  Negative for color change.  Neurological:  Negative for speech difficulty, weakness and light-headedness.       Memory lapses.   Psychiatric/Behavioral:  Positive for agitation, behavioral problems, confusion and sleep disturbance. The patient is nervous/anxious.        Reported the patient's anxious episodes, early am awake, wandering, restlessness, angry spells, stabilizing since Haldol prn started.    Immunization History  Administered Date(s) Administered   Influenza, High Dose Seasonal PF 11/21/2013   PFIZER(Purple Top)SARS-COV-2 Vaccination 02/16/2019, 03/09/2019, 10/26/2019, 06/24/2020   PPD Test 10/23/2005   Pneumococcal Conjugate-13 08/08/2013   Pneumococcal Polysaccharide-23 12/27/2011, 01/25/2018   Tdap 11/28/2009   Zoster, Live 03/15/2005   Pertinent  Health Maintenance Due  Topic Date Due   DEXA SCAN  Never done   INFLUENZA VACCINE  08/25/2020   PNA vac Low Risk Adult  Completed   No flowsheet data found. Functional Status Survey:    Vitals:   08/08/20 0959  BP: 138/68  Pulse: 62  Resp: 16  Temp: 98.2 F (36.8 C)  SpO2: 97%  Weight: 108 lb 12.8 oz (49.4 kg)  Height: 5\' 5"  (1.651 m)   Body mass index is 18.11 kg/m. Physical Exam Vitals and nursing note reviewed.  Constitutional:      Appearance: Normal appearance.  HENT:     Head: Normocephalic and atraumatic.     Mouth/Throat:     Mouth: Mucous membranes are moist.  Eyes:     Extraocular Movements: Extraocular movements intact.     Conjunctiva/sclera: Conjunctivae normal.     Pupils: Pupils are equal, round, and reactive to light.  Cardiovascular:     Rate and Rhythm: Normal rate and regular rhythm.     Heart sounds: No murmur heard. Pulmonary:     Effort: Pulmonary effort is normal.     Breath sounds: No rales.  Abdominal:     General: Bowel sounds are normal.     Palpations: Abdomen is soft.      Tenderness: There is no abdominal tenderness.  Musculoskeletal:     Cervical back: Normal range of motion and neck supple.     Right lower leg: No edema.     Left lower leg: No edema.  Skin:    General: Skin is warm and dry.  Neurological:     General: No focal deficit present.     Mental Status: She is alert. Mental status is at baseline.  Gait: Gait normal.     Comments: Oriented to person  Psychiatric:        Mood and Affect: Mood normal.     Comments: Smile, followed simple directions.     Labs reviewed: Recent Labs    06/18/20 0000 07/16/20 0000  NA 136* 136*  K 4.1 4.5  CL 100 98*  CO2 29* 30*  BUN 12  12 14   CREATININE 0.8  0.8 0.7  CALCIUM 9.3 9.6   Recent Labs    06/18/20 0000 07/16/20 0000  AST 23 29  ALT 18 26  ALKPHOS 59 69  ALBUMIN 4.2 4.3   Recent Labs    06/18/20 0000 07/16/20 0000  WBC 5.9 7.6  NEUTROABS 2,903.00 3,762.00  HGB 15.0 15.0  HCT 45 46  PLT 337 388   Lab Results  Component Value Date   TSH 1.87 06/18/2020   No results found for: HGBA1C Lab Results  Component Value Date   CHOL 178 06/18/2020   HDL 77 (A) 06/18/2020   LDLCALC 86 06/18/2020   TRIG 61 06/18/2020    Significant Diagnostic Results in last 30 days:  No results found.  Assessment/Plan Depression with anxiety Depression/anxiety, takes Escitalopram Seroquel, Depakote, Clonazepam, prn Haldo has been effective for restlessness, angry spells, and agitation.   Alzheimer disease ([place]) Alzheimer's dementia, takes Donepezil 5mg  qd, no CT/MRI of brain located. F/u [facility] memory care center, MMSE 17/30. Adding Memantine. TSH 1.875/24/22  Hyperlipidemia takes Rosuvastatin, LDL 86 06/17/20  Vitamin B12 deficiency Vit B12 deficiency, takes Vit B 12 3000mg  qd, Vit B12 level 551 06/17/20  Vitamin D deficiency Vit D deficiency, takes Vit 2000 u daily    Family/ staff Communication: plan of care reviewed with the patient and charge nurse.   Labs/tests  ordered:  none  Time spend 35 minutes.

## 2020-08-12 DIAGNOSIS — B351 Tinea unguium: Secondary | ICD-10-CM | POA: Diagnosis not present

## 2020-08-12 DIAGNOSIS — R41841 Cognitive communication deficit: Secondary | ICD-10-CM | POA: Diagnosis not present

## 2020-08-12 DIAGNOSIS — L84 Corns and callosities: Secondary | ICD-10-CM | POA: Diagnosis not present

## 2020-08-12 DIAGNOSIS — M79672 Pain in left foot: Secondary | ICD-10-CM | POA: Diagnosis not present

## 2020-08-12 DIAGNOSIS — M79671 Pain in right foot: Secondary | ICD-10-CM | POA: Diagnosis not present

## 2020-08-18 ENCOUNTER — Non-Acute Institutional Stay (SKILLED_NURSING_FACILITY): Payer: Medicare PPO | Admitting: Nurse Practitioner

## 2020-08-18 DIAGNOSIS — E559 Vitamin D deficiency, unspecified: Secondary | ICD-10-CM

## 2020-08-18 DIAGNOSIS — F418 Other specified anxiety disorders: Secondary | ICD-10-CM

## 2020-08-18 DIAGNOSIS — G309 Alzheimer's disease, unspecified: Secondary | ICD-10-CM

## 2020-08-18 DIAGNOSIS — F0280 Dementia in other diseases classified elsewhere without behavioral disturbance: Secondary | ICD-10-CM

## 2020-08-18 DIAGNOSIS — E538 Deficiency of other specified B group vitamins: Secondary | ICD-10-CM

## 2020-08-18 DIAGNOSIS — M25572 Pain in left ankle and joints of left foot: Secondary | ICD-10-CM | POA: Diagnosis not present

## 2020-08-18 DIAGNOSIS — M810 Age-related osteoporosis without current pathological fracture: Secondary | ICD-10-CM | POA: Diagnosis not present

## 2020-08-18 DIAGNOSIS — E785 Hyperlipidemia, unspecified: Secondary | ICD-10-CM | POA: Diagnosis not present

## 2020-08-19 ENCOUNTER — Encounter: Payer: Self-pay | Admitting: Nurse Practitioner

## 2020-08-19 DIAGNOSIS — M25572 Pain in left ankle and joints of left foot: Secondary | ICD-10-CM | POA: Insufficient documentation

## 2020-08-19 DIAGNOSIS — F0281 Dementia in other diseases classified elsewhere with behavioral disturbance: Secondary | ICD-10-CM | POA: Diagnosis not present

## 2020-08-19 DIAGNOSIS — I10 Essential (primary) hypertension: Secondary | ICD-10-CM | POA: Diagnosis not present

## 2020-08-19 DIAGNOSIS — E785 Hyperlipidemia, unspecified: Secondary | ICD-10-CM | POA: Diagnosis not present

## 2020-08-19 DIAGNOSIS — M25561 Pain in right knee: Secondary | ICD-10-CM | POA: Insufficient documentation

## 2020-08-19 NOTE — Assessment & Plan Note (Signed)
Vit B12 deficiency, takes Vit B 12 '3000mg'$  qd, Vit B12 level 551 06/17/20. Hgb 15.0 07/16/20

## 2020-08-19 NOTE — Assessment & Plan Note (Signed)
OP, takes Alendronate(started 2019 after foot fx) needs DEXA

## 2020-08-19 NOTE — Assessment & Plan Note (Signed)
Alzheimer's dementia, takes Donepezil '5mg'$  qd, started Memantine, no CT/MRI of brain located. F/u [facility] memory care center, MMSE 17/30. TSH 1.875/24/22.

## 2020-08-19 NOTE — Assessment & Plan Note (Signed)
takes Rosuvastatin, LDL 86 06/17/20

## 2020-08-19 NOTE — Assessment & Plan Note (Signed)
Depression/anxiety, takes Escitalopram Seroquel, Depakote, Clonazepam, prn Haldol  has been effective for restlessness, angry spells, and agitation.

## 2020-08-19 NOTE — Assessment & Plan Note (Addendum)
Sudden onset, 2-3 days duration, denied trauma or injury, no observed bruise, able to walk, but c/o pain constantly, noted swelling and mild erythema, felt warmer. Will obtain CBC/diff, CMP/eGFR, uric acid, ESR, CRP. May consider X-ray if pain worsens especial when weight bearing.  08/21/20 wbc 7.6, Hgb 13.9, plt 294, neutrophils 35.2, ESR 2, uric aicd 3.6, CRP 0.3, Na 138, K 4.1 Bun 14, creat 0.67, eGFR 91. better

## 2020-08-19 NOTE — Assessment & Plan Note (Signed)
Vit D deficiency, takes Vit 2000 u daily

## 2020-08-19 NOTE — Progress Notes (Signed)
Location:   [facility] Room Number: [place] of Service:  SNF (31) Provider:  Marsi Turvey Otho Darner, NP    Patient Care Team: [name], MD as PCP - General (Internal Medicine)  Extended Emergency Contact Information Primary Emergency Contact: [place] of [place] Phone: [phone] Relation: Spouse Secondary Emergency Contact: Dulcinea, [place] Mobile Phone: [phone] Relation: Other  Code Status:  DNR Goals of care: Advanced Directive information Advanced Directives 08/19/2020  Does Patient Have a Medical Advance Directive? Yes  Type of Paramedic of [place];Living will  Does patient want to make changes to medical advance directive? No - Patient declined  Copy of [place] in Chart? Yes - validated most recent copy scanned in chart (See row information)     Chief Complaint  Patient presents with   Acute Visit    Patient complains of swelling of the left ankle.    HPI:  Pt is a 75 y.o. female seen today for an acute visit for a sudden onset, 2-3 days duration, denied trauma or injury, no observed bruise, able to walk, but c/o pain constantly, noted swelling and mild erythema, felt warmer.  Alzheimer's dementia, takes Donepezil $RemoveBeforeD'5mg'qJpjOkjrCCtopK$  qd, started Memantine, no CT/MRI of brain located. F/u [facility] memory care center, MMSE 17/30. TSH 1.875/24/22.              Hyperlipidemia, takes Rosuvastatin, LDL 86 06/17/20             Depression/anxiety, takes Escitalopram Seroquel, Depakote, Clonazepam, prn Haldol  has been effective for restlessness, angry spells, and agitation.              Vit B12 deficiency, takes Vit B 12 $Re'3000mg'Fgu$  qd, Vit B12 level 551 06/17/20. Hgb 15.0 07/16/20             Vit D deficiency, takes Vit 2000 u daily             OP, takes Alendronate(started 2019 after foot fx) needs DEXA Past Medical History:  Diagnosis Date   Abnormal weight loss    Age-related osteoporosis without  current pathological fracture    Allergic rhinitis    Atherosclerosis of aorta (HCC)    Chronic obstructive pulmonary disease (COPD) (HCC)    Corns and callosities    Depression    H/O mammogram 2021   Per [facility] New Patient Packet   Hyperlipemia    Hyperlipidemia    Melanocytic nevi, unspecified    Mild cognitive impairment, so stated    Osteopenia    Osteoporosis    Other specified disorders of bone density and structure, unspecified site    Pain in unspecified ankle and joints of unspecified foot    Past Surgical History:  Procedure Laterality Date   COLONOSCOPY  2020   Per [facility] New Patient Packet    Allergies  Allergen Reactions   Sertraline Other (See Comments)    Suicidal thoughts   Penicillin G Rash    Welts    Allergies as of 08/18/2020       Reactions   Sertraline Other (See Comments)   Suicidal thoughts   Penicillin G Rash   Welts        Medication List        Accurate as of August 18, 2020 11:59 PM. If you have any questions, ask your nurse or doctor.          alendronate 70 MG tablet Commonly known  as: FOSAMAX Take 70 mg by mouth once a week. Take with a full glass of water on an empty stomach.   calcium carbonate 1500 (600 Ca) MG Tabs tablet Commonly known as: OSCAL Take 600 mg by mouth 2 (two) times daily with a meal.   clonazePAM 0.5 MG tablet Commonly known as: KLONOPIN Take 1 tablet (0.5 mg total) by mouth 2 (two) times daily.   CYANOCOBALAMIN PO Take 3,000 mcg by mouth daily.   divalproex 125 MG DR tablet Commonly known as: DEPAKOTE Take 125 mg by mouth 3 (three) times daily.   donepezil 5 MG tablet Commonly known as: ARICEPT   escitalopram 10 MG tablet Commonly known as: LEXAPRO Take 10 mg by mouth daily.   haloperidol 1 MG tablet Commonly known as: HALDOL Take 1 mg by mouth every 8 (eight) hours as needed for agitation.   memantine 5 MG tablet Commonly known as: NAMENDA Take 5 mg by mouth 2 (two) times daily.    multivitamin capsule Take by mouth.   QUEtiapine 25 MG tablet Commonly known as: SEROQUEL Take 25 mg by mouth 2 (two) times daily.   rosuvastatin 10 MG tablet Commonly known as: CRESTOR Take 10 mg by mouth daily.   Vitamin D 50 MCG (2000 UT) tablet Take 2,000 Units by mouth daily.        Review of Systems  Constitutional:  Negative for appetite change, fatigue and fever.  HENT:  Negative for congestion, hearing loss and trouble swallowing.   Eyes:  Negative for visual disturbance.  Respiratory:  Negative for shortness of breath.   Cardiovascular:  Positive for leg swelling.  Gastrointestinal:  Negative for abdominal pain and constipation.  Genitourinary:  Negative for dysuria, frequency and urgency.  Musculoskeletal:  Positive for arthralgias and joint swelling. Negative for gait problem.       Left ankle.   Skin:  Negative for color change.  Neurological:  Negative for speech difficulty, weakness and light-headedness.       Memory lapses.   Psychiatric/Behavioral:  Positive for agitation, behavioral problems, confusion and sleep disturbance. The patient is nervous/anxious.        Reported the patient's anxious episodes, early am awake, wandering, restlessness, angry spells, stabilizing since Haldol prn started.    Immunization History  Administered Date(s) Administered   Influenza Inj Mdck Quad With Preservative 10/11/2015   Influenza, High Dose Seasonal PF 11/21/2013   Influenza, Quadrivalent, Recombinant, Inj, Pf 10/01/2017, 10/12/2018, 10/27/2019   Influenza-Unspecified 11/22/2014   PFIZER(Purple Top)SARS-COV-2 Vaccination 02/16/2019, 03/09/2019, 10/26/2019, 06/24/2020   PPD Test 10/23/2005   Pneumococcal Conjugate-13 08/08/2013   Pneumococcal Polysaccharide-23 12/27/2011, 01/25/2018   Tdap 11/28/2009   Zoster, Live 03/15/2005   Zoster, Unspecified 09/01/2016, 12/02/2016   Pertinent  Health Maintenance Due  Topic Date Due   DEXA SCAN  Never done   INFLUENZA  VACCINE  08/25/2020   PNA vac Low Risk Adult  Completed   No flowsheet data found. Functional Status Survey:    Vitals:   08/19/20 0830  BP: 114/68  Pulse: 60  Resp: 16  Temp: 97.8 F (36.6 C)  SpO2: 98%  Weight: 108 lb 12.8 oz (49.4 kg)  Height: $Remove'5\' 5"'xIQzoJm$  (1.651 m)   Body mass index is 18.11 kg/m. Physical Exam Vitals and nursing note reviewed.  Constitutional:      Appearance: Normal appearance.  HENT:     Head: Normocephalic and atraumatic.     Mouth/Throat:     Mouth: Mucous membranes are moist.  Eyes:  Extraocular Movements: Extraocular movements intact.     Conjunctiva/sclera: Conjunctivae normal.     Pupils: Pupils are equal, round, and reactive to light.  Cardiovascular:     Rate and Rhythm: Normal rate and regular rhythm.     Heart sounds: No murmur heard. Pulmonary:     Effort: Pulmonary effort is normal.     Breath sounds: No rales.  Abdominal:     General: Bowel sounds are normal.     Palpations: Abdomen is soft.     Tenderness: There is no abdominal tenderness.  Musculoskeletal:        General: Swelling and tenderness present.     Cervical back: Normal range of motion and neck supple.     Right lower leg: No edema.     Left lower leg: Edema present.     Comments: Mild erythema, warmth left ankle and swelling. Reported pain in the left ankle is constant. Able to bear weight, ambulates without limping. Dorsalis pedis pulses present. No tenderness in calves.   Skin:    General: Skin is warm and dry.  Neurological:     General: No focal deficit present.     Mental Status: She is alert. Mental status is at baseline.     Gait: Gait normal.     Comments: Oriented to person  Psychiatric:        Mood and Affect: Mood normal.     Comments: Smile, followed simple directions.     Labs reviewed: Recent Labs    06/18/20 0000 07/16/20 0000  NA 136* 136*  K 4.1 4.5  CL 100 98*  CO2 29* 30*  BUN 12  12 14   CREATININE 0.8  0.8 0.7  CALCIUM 9.3 9.6    Recent Labs    06/18/20 0000 07/16/20 0000  AST 23 29  ALT 18 26  ALKPHOS 59 69  ALBUMIN 4.2 4.3   Recent Labs    06/18/20 0000 07/16/20 0000  WBC 5.9 7.6  NEUTROABS 2,903.00 3,762.00  HGB 15.0 15.0  HCT 45 46  PLT 337 388   Lab Results  Component Value Date   TSH 1.87 06/18/2020   No results found for: HGBA1C Lab Results  Component Value Date   CHOL 178 06/18/2020   HDL 77 (A) 06/18/2020   LDLCALC 86 06/18/2020   TRIG 61 06/18/2020    Significant Diagnostic Results in last 30 days:  No results found.  Assessment/Plan Acute left ankle pain Sudden onset, 2-3 days duration, denied trauma or injury, no observed bruise, able to walk, but c/o pain constantly, noted swelling and mild erythema, felt warmer. Will obtain CBC/diff, CMP/eGFR, uric acid, ESR, CRP. May consider X-ray if pain worsens especial when weight bearing.   Alzheimer disease (HCC) Alzheimer's dementia, takes Donepezil 5mg  qd, started Memantine, no CT/MRI of brain located. F/u [facility] memory care center, MMSE 17/30. TSH 1.875/24/22.   Hyperlipidemia takes Rosuvastatin, LDL 86 06/17/20  Depression with anxiety Depression/anxiety, takes Escitalopram Seroquel, Depakote, Clonazepam, prn Haldol  has been effective for restlessness, angry spells, and agitation.   Vitamin B12 deficiency Vit B12 deficiency, takes Vit B 12 3000mg  qd, Vit B12 level 551 06/17/20. Hgb 15.0 07/16/20  Vitamin D deficiency Vit D deficiency, takes Vit 2000 u daily  Osteoporosis OP, takes Alendronate(started 2019 after foot fx) needs DEXA    Family/ staff Communication: plan of care reviewed with the patient and charge nurse.   Labs/tests ordered:  CBC/diff, CMP/eGFR. Uric acid, ESR, CRP  Time spend 35  minutes.

## 2020-08-20 ENCOUNTER — Ambulatory Visit (INDEPENDENT_AMBULATORY_CARE_PROVIDER_SITE_OTHER): Payer: Medicare PPO

## 2020-08-20 ENCOUNTER — Other Ambulatory Visit: Payer: Self-pay

## 2020-08-20 ENCOUNTER — Ambulatory Visit: Payer: Medicare PPO

## 2020-08-20 ENCOUNTER — Ambulatory Visit: Payer: Medicare PPO | Admitting: Podiatry

## 2020-08-20 DIAGNOSIS — S9032XA Contusion of left foot, initial encounter: Secondary | ICD-10-CM

## 2020-08-20 DIAGNOSIS — M7752 Other enthesopathy of left foot: Secondary | ICD-10-CM | POA: Diagnosis not present

## 2020-08-20 LAB — BASIC METABOLIC PANEL
BUN: 14 (ref 4–21)
CO2: 29 — AB (ref 13–22)
Chloride: 99 (ref 99–108)
Creatinine: 0.7 (ref 0.5–1.1)
Glucose: 67
Potassium: 4.1 (ref 3.4–5.3)
Sodium: 138 (ref 137–147)

## 2020-08-20 LAB — CBC AND DIFFERENTIAL
HCT: 41 (ref 36–46)
Hemoglobin: 13.9 (ref 12.0–16.0)
Neutrophils Absolute: 3595
Platelets: 294 (ref 150–399)
WBC: 7.6

## 2020-08-20 LAB — HEPATIC FUNCTION PANEL
ALT: 20 (ref 7–35)
AST: 22 (ref 13–35)
Alkaline Phosphatase: 57 (ref 25–125)
Bilirubin, Total: 0.5

## 2020-08-20 LAB — COMPREHENSIVE METABOLIC PANEL
Albumin: 3.7 (ref 3.5–5.0)
Calcium: 9.4 (ref 8.7–10.7)
Globulin: 2.2

## 2020-08-20 LAB — POCT ERYTHROCYTE SEDIMENTATION RATE, NON-AUTOMATED: Sed Rate: 2

## 2020-08-20 LAB — CBC: RBC: 4.27 (ref 3.87–5.11)

## 2020-08-20 MED ORDER — BETAMETHASONE SOD PHOS & ACET 6 (3-3) MG/ML IJ SUSP
3.0000 mg | Freq: Once | INTRAMUSCULAR | Status: AC
  Administered 2020-08-20: 3 mg via INTRA_ARTICULAR

## 2020-08-20 NOTE — Patient Instructions (Signed)
Ankle capsulitis left - recommend OTC advil or tylenol as needed -Wear compression ankle sleeve daily

## 2020-08-20 NOTE — Progress Notes (Signed)
   Subjective:  75 y.o. female presenting today with her husband as a new patient for evaluation of left ankle pain.  Pain has been present for about 1 week now.  She has noticed some swelling to the area.  She cannot recall an incident of tripping or falling.  She has been taking OTC Advil as needed.  She presents for further treatment and evaluation   Past Medical History:  Diagnosis Date   Abnormal weight loss    Age-related osteoporosis without current pathological fracture    Allergic rhinitis    Atherosclerosis of aorta (HCC)    Chronic obstructive pulmonary disease (COPD) (HCC)    Corns and callosities    Depression    H/O mammogram 2021   Per [facility] New Patient Packet   Hyperlipemia    Hyperlipidemia    Melanocytic nevi, unspecified    Mild cognitive impairment, so stated    Osteopenia    Osteoporosis    Other specified disorders of bone density and structure, unspecified site    Pain in unspecified ankle and joints of unspecified foot      Objective / Physical Exam:  General:  The patient is alert and oriented x3 in no acute distress. Dermatology:  Skin is warm, dry and supple bilateral lower extremities. Negative for open lesions or macerations. Vascular:  Palpable pedal pulses bilaterally.  Mild edema noted to the lateral aspect of the left ankle capillary refill within normal limits. Neurological:  Epicritic and protective threshold grossly intact bilaterally.  Musculoskeletal Exam:  Pain on palpation to the lateral of the patient's left ankle. Mild edema noted. Range of motion within normal limits to all pedal and ankle joints bilateral. Muscle strength 5/5 in all groups bilateral.   Radiographic Exam:  Normal osseous mineralization. Joint spaces preserved. No fracture/dislocation/boney destruction.    Assessment: 1.  Ankle capsulitis with soft tissue swelling lateral aspect of the left ankle  Plan of Care:  1. Patient was evaluated. X-Rays reviewed.  2.  Injection of 0.5 mL Celestone Soluspan injected in the patient's left ankle. 3.  Compression ankle sleeve dispensed 4.  Patient already on multiple medications.  We will forego any prescription medication. 5.  Recommend OTC Advil or Tylenol as needed 6.  Return to clinic as needed   Edrick Kins, DPM [facility]  Dr. Edrick Kins, DPM    [address], [place] [address]                Office [phone]  Fax [phone]

## 2020-09-05 DIAGNOSIS — F419 Anxiety disorder, unspecified: Secondary | ICD-10-CM | POA: Diagnosis not present

## 2020-09-05 DIAGNOSIS — F0390 Unspecified dementia without behavioral disturbance: Secondary | ICD-10-CM | POA: Diagnosis not present

## 2020-09-10 DIAGNOSIS — R1313 Dysphagia, pharyngeal phase: Secondary | ICD-10-CM | POA: Diagnosis not present

## 2020-09-10 DIAGNOSIS — M25672 Stiffness of left ankle, not elsewhere classified: Secondary | ICD-10-CM | POA: Diagnosis not present

## 2020-09-10 DIAGNOSIS — R1310 Dysphagia, unspecified: Secondary | ICD-10-CM | POA: Diagnosis not present

## 2020-09-16 ENCOUNTER — Other Ambulatory Visit: Payer: Self-pay

## 2020-09-16 MED ORDER — CLONAZEPAM 0.5 MG PO TABS: 0.5000 mg | ORAL_TABLET | Freq: Two times a day (BID) | ORAL | 0 refills | Status: DC

## 2020-09-16 NOTE — Telephone Encounter (Signed)
Incoming fax received from [facility], requesting refill on SNF patient

## 2020-09-19 ENCOUNTER — Encounter: Payer: Self-pay | Admitting: Nurse Practitioner

## 2020-09-19 DIAGNOSIS — M25672 Stiffness of left ankle, not elsewhere classified: Secondary | ICD-10-CM | POA: Diagnosis not present

## 2020-09-19 DIAGNOSIS — R1313 Dysphagia, pharyngeal phase: Secondary | ICD-10-CM | POA: Diagnosis not present

## 2020-09-19 DIAGNOSIS — R1310 Dysphagia, unspecified: Secondary | ICD-10-CM | POA: Diagnosis not present

## 2020-09-23 ENCOUNTER — Encounter: Payer: Self-pay | Admitting: Internal Medicine

## 2020-09-23 ENCOUNTER — Non-Acute Institutional Stay (SKILLED_NURSING_FACILITY): Payer: Medicare PPO | Admitting: Internal Medicine

## 2020-09-23 DIAGNOSIS — E559 Vitamin D deficiency, unspecified: Secondary | ICD-10-CM | POA: Diagnosis not present

## 2020-09-23 DIAGNOSIS — E785 Hyperlipidemia, unspecified: Secondary | ICD-10-CM

## 2020-09-23 DIAGNOSIS — G309 Alzheimer's disease, unspecified: Secondary | ICD-10-CM | POA: Diagnosis not present

## 2020-09-23 DIAGNOSIS — E538 Deficiency of other specified B group vitamins: Secondary | ICD-10-CM | POA: Diagnosis not present

## 2020-09-23 DIAGNOSIS — R1312 Dysphagia, oropharyngeal phase: Secondary | ICD-10-CM

## 2020-09-23 DIAGNOSIS — M810 Age-related osteoporosis without current pathological fracture: Secondary | ICD-10-CM | POA: Diagnosis not present

## 2020-09-23 DIAGNOSIS — F0280 Dementia in other diseases classified elsewhere without behavioral disturbance: Secondary | ICD-10-CM

## 2020-09-23 DIAGNOSIS — F418 Other specified anxiety disorders: Secondary | ICD-10-CM

## 2020-09-23 LAB — C-REACTIVE PROTEIN
CRP: 0.3
Uric Acid: 3.6

## 2020-09-23 NOTE — Progress Notes (Signed)
Location:   [facility] Room Number: 110 Place of Service:  SNF [phone]) Provider:  Veleta Miners MD  [name], MD  Patient Care Team: [name], MD as PCP - General (Internal Medicine)  Extended Emergency Contact Information Primary Emergency Contact: Pollyann [place] of [place] Phone: [phone] Relation: Spouse Secondary Emergency Contact: Phed[place], [place] Mobile Phone: [phone] Relation: Other  Code Status:   Goals of care: Advanced Directive information Advanced Directives 09/23/2020  Does Patient Have a Medical Advance Directive? Yes  Type of Paramedic of [place];Living will  Does patient want to make changes to medical advance directive? No - Patient declined  Copy of [place] in Chart? Yes - validated most recent copy scanned in chart (See row information)     Chief Complaint  Patient presents with   Medical Management of Chronic Issues   Quality Metric Gaps    Hep C screening, shingrix, Dexa scan, TDAP, Flu shot    HPI:  Pt is a 75 y.o. female seen today for medical management of chronic diseases.    Patient has a history of Alzheimer's disease,, osteoporosis, vitamin D and B12 deficiency, hyperlipidemia, anxiety  Before the admission patient Eloped her apartment and was found by the facility staff.  Her husband decided to move her to memory unit   Since she has been here she had been trying to Elope the facility Started on Seroquel, Depakote and Klonopin  Behaviors much better.Also Started on Namenda  Doing better More Calmer. Able to go out with her husband and come back Taken off PRN Haldol Weight is good No Falls Was just Diagnosed with Capsulitis of Left Ankle and is s/o Injection  Past Medical History:  Diagnosis Date   Abnormal weight loss    Age-related osteoporosis without current pathological fracture    Allergic rhinitis    Atherosclerosis of  aorta (HCC)    Chronic obstructive pulmonary disease (COPD) (HCC)    Corns and callosities    Depression    H/O mammogram 2021   Per [facility] New Patient Packet   Hyperlipemia    Hyperlipidemia    Melanocytic nevi, unspecified    Mild cognitive impairment, so stated    Osteopenia    Osteoporosis    Other specified disorders of bone density and structure, unspecified site    Pain in unspecified ankle and joints of unspecified foot    Past Surgical History:  Procedure Laterality Date   COLONOSCOPY  2020   Per [facility] New Patient Packet    Allergies  Allergen Reactions   Sertraline Other (See Comments)    Suicidal thoughts   Penicillin G Rash    Welts    Allergies as of 09/23/2020       Reactions   Sertraline Other (See Comments)   Suicidal thoughts   Penicillin G Rash   Welts        Medication List        Accurate as of September 23, 2020 10:02 AM. If you have any questions, ask your nurse or doctor.          acetaminophen 325 MG tablet Commonly known as: TYLENOL Take 650 mg by mouth 2 (two) times daily as needed.   alendronate 70 MG tablet Commonly known as: FOSAMAX Take 70 mg by mouth once a week. Take with a full glass of water on an empty stomach.   calcium carbonate 1500 (600 Ca) MG Tabs  tablet Commonly known as: OSCAL Take 600 mg by mouth 2 (two) times daily with a meal.   clonazePAM 0.5 MG tablet Commonly known as: KLONOPIN Take 1 tablet (0.5 mg total) by mouth 2 (two) times daily.   CYANOCOBALAMIN PO Take 3,000 mcg by mouth daily.   divalproex 125 MG DR tablet Commonly known as: DEPAKOTE Take 125 mg by mouth 3 (three) times daily.   donepezil 5 MG tablet Commonly known as: ARICEPT   escitalopram 10 MG tablet Commonly known as: LEXAPRO Take 10 mg by mouth daily.   haloperidol 1 MG tablet Commonly known as: HALDOL Take 1 mg by mouth every 8 (eight) hours as needed for agitation.   memantine 5 MG tablet Commonly known as: NAMENDA Take 5 mg  by mouth 2 (two) times daily.   multivitamin capsule Take by mouth.   QUEtiapine 25 MG tablet Commonly known as: SEROQUEL Take 25 mg by mouth 2 (two) times daily.   rosuvastatin 10 MG tablet Commonly known as: CRESTOR Take 10 mg by mouth daily.   Vitamin D 50 MCG (2000 UT) tablet Take 2,000 Units by mouth daily.        Review of Systems  Unable to perform ROS: Dementia   Immunization History  Administered Date(s) Administered   Influenza Inj Mdck Quad With Preservative 10/11/2015   Influenza, High Dose Seasonal PF 11/21/2013   Influenza, Quadrivalent, Recombinant, Inj, Pf 10/01/2017, 10/12/2018, 10/27/2019   Influenza-Unspecified 11/22/2014   PFIZER(Purple Top)SARS-COV-2 Vaccination 02/16/2019, 03/09/2019, 10/26/2019, 06/24/2020   PPD Test 10/23/2005   Pneumococcal Conjugate-13 08/08/2013   Pneumococcal Polysaccharide-23 12/27/2011, 01/25/2018   Tdap 11/28/2009   Zoster, Live 03/15/2005   Zoster, Unspecified 09/01/2016, 12/02/2016   Pertinent  Health Maintenance Due  Topic Date Due   DEXA SCAN  Never done   INFLUENZA VACCINE  08/25/2020   PNA vac Low Risk Adult  Completed   No flowsheet data found. Functional Status Survey:    Vitals:   09/23/20 0943  BP: 110/60  Pulse: 60  Resp: 20  Temp: (!) 97.5 F (36.4 C)  SpO2: 96%  Weight: 113 lb 3.2 oz (51.3 kg)  Height: '5\' 5"'$  (1.651 m)   Body mass index is 18.84 kg/m. Physical Exam Constitutional: . Well-developed and well-nourished.  HENT:  Head: Normocephalic.  Mouth/Throat: Oropharynx is clear and moist.  Eyes: Pupils are equal, round, and reactive to light.  Neck: Neck supple.  Cardiovascular: Normal rate and normal heart sounds.  No murmur heard. Pulmonary/Chest: Effort normal and breath sounds normal. No respiratory distress. No wheezes. She has no rales.  Abdominal: Soft. Bowel sounds are normal. No distension. There is no tenderness. There is no rebound.  Musculoskeletal: No edema.   Lymphadenopathy: none Neurological: No Focal Deficits  Skin: Skin is warm and dry.  Psychiatric: Normal mood and affect. Behavior is normal. Thought content normal.   Labs reviewed: Recent Labs    06/18/20 0000 07/16/20 0000 08/20/20 0000  NA 136* 136* 138  K 4.1 4.5 4.1  CL 100 98* 99  CO2 29* 30* 29*  BUN '12  12 14 14  '$ CREATININE 0.8  0.8 0.7 0.7  CALCIUM 9.3 9.6 9.4   Recent Labs    06/18/20 0000 07/16/20 0000 08/20/20 0000  AST '23 29 22  '$ ALT '18 26 20  '$ ALKPHOS 59 69 57  ALBUMIN 4.2 4.3 3.7   Recent Labs    06/18/20 0000 07/16/20 0000 08/20/20 0000  WBC 5.9 7.6 7.6  NEUTROABS 2,903.00 3,762.00 3,595.00  HGB 15.0 15.0 13.9  HCT 45 46 41  PLT 337 388 294   Lab Results  Component Value Date   TSH 1.87 06/18/2020   No results found for: HGBA1C Lab Results  Component Value Date   CHOL 178 06/18/2020   HDL 77 (A) 06/18/2020   LDLCALC 86 06/18/2020   TRIG 61 06/18/2020    Significant Diagnostic Results in last 30 days:  No results found.  Assessment/Plan Alzheimer disease ([place]) Will Increase her Namenda to 10 mg BID  Also try to decrease her Klonopin to 0.25 mg BID to see if we can eventually taper her off Already on Seroquel and Depakote  Hyperlipidemia,  On Statin LDL less then 100 Depression with anxiety On Lexapro Vitamin B12 deficiency Continue Supplement Vitamin D deficiency  Osteoporosis,  Continue Fosamax Will Order her DEXA around in Jan 2023 when she is due for her Mammogrm so she can go there together Oropharyngeal dysphagia New Issue Working with Conservation officer, historic buildings staff Communication:   Labs/tests ordered:

## 2020-09-24 DIAGNOSIS — R1313 Dysphagia, pharyngeal phase: Secondary | ICD-10-CM | POA: Diagnosis not present

## 2020-09-24 DIAGNOSIS — R1310 Dysphagia, unspecified: Secondary | ICD-10-CM | POA: Diagnosis not present

## 2020-09-24 DIAGNOSIS — M25672 Stiffness of left ankle, not elsewhere classified: Secondary | ICD-10-CM | POA: Diagnosis not present

## 2020-09-25 DIAGNOSIS — R1313 Dysphagia, pharyngeal phase: Secondary | ICD-10-CM | POA: Diagnosis not present

## 2020-09-25 DIAGNOSIS — R1310 Dysphagia, unspecified: Secondary | ICD-10-CM | POA: Diagnosis not present

## 2020-09-29 DIAGNOSIS — R1313 Dysphagia, pharyngeal phase: Secondary | ICD-10-CM | POA: Diagnosis not present

## 2020-09-29 DIAGNOSIS — R1310 Dysphagia, unspecified: Secondary | ICD-10-CM | POA: Diagnosis not present

## 2020-10-01 ENCOUNTER — Encounter: Payer: Self-pay | Admitting: Internal Medicine

## 2020-10-01 DIAGNOSIS — R1310 Dysphagia, unspecified: Secondary | ICD-10-CM | POA: Diagnosis not present

## 2020-10-01 DIAGNOSIS — R1313 Dysphagia, pharyngeal phase: Secondary | ICD-10-CM | POA: Diagnosis not present

## 2020-10-02 DIAGNOSIS — R1310 Dysphagia, unspecified: Secondary | ICD-10-CM | POA: Diagnosis not present

## 2020-10-02 DIAGNOSIS — R1313 Dysphagia, pharyngeal phase: Secondary | ICD-10-CM | POA: Diagnosis not present

## 2020-10-06 ENCOUNTER — Non-Acute Institutional Stay (SKILLED_NURSING_FACILITY): Payer: Medicare PPO | Admitting: Nurse Practitioner

## 2020-10-06 ENCOUNTER — Encounter: Payer: Self-pay | Admitting: Nurse Practitioner

## 2020-10-06 DIAGNOSIS — E559 Vitamin D deficiency, unspecified: Secondary | ICD-10-CM | POA: Diagnosis not present

## 2020-10-06 DIAGNOSIS — F0280 Dementia in other diseases classified elsewhere without behavioral disturbance: Secondary | ICD-10-CM

## 2020-10-06 DIAGNOSIS — R1310 Dysphagia, unspecified: Secondary | ICD-10-CM | POA: Diagnosis not present

## 2020-10-06 DIAGNOSIS — R1313 Dysphagia, pharyngeal phase: Secondary | ICD-10-CM | POA: Diagnosis not present

## 2020-10-06 DIAGNOSIS — R1312 Dysphagia, oropharyngeal phase: Secondary | ICD-10-CM | POA: Diagnosis not present

## 2020-10-06 DIAGNOSIS — E785 Hyperlipidemia, unspecified: Secondary | ICD-10-CM

## 2020-10-06 DIAGNOSIS — G309 Alzheimer's disease, unspecified: Secondary | ICD-10-CM | POA: Diagnosis not present

## 2020-10-06 DIAGNOSIS — M810 Age-related osteoporosis without current pathological fracture: Secondary | ICD-10-CM

## 2020-10-06 DIAGNOSIS — F418 Other specified anxiety disorders: Secondary | ICD-10-CM | POA: Diagnosis not present

## 2020-10-06 DIAGNOSIS — E538 Deficiency of other specified B group vitamins: Secondary | ICD-10-CM

## 2020-10-06 NOTE — Assessment & Plan Note (Signed)
takes Alendronate(started 2019 after foot fx) needs DEXA

## 2020-10-06 NOTE — Assessment & Plan Note (Signed)
takes Vit 2000 u daily

## 2020-10-06 NOTE — Progress Notes (Signed)
Location:   [place] Room Number: 110 A Place of Service:  SNF (31) Provider:  Mat, Melannie Metzner X, NP  [name], MD  Patient Care Team: [name], MD as PCP - General (Internal Medicine)  Extended Emergency Contact Information Primary Emergency Contact: Studley,[name]  [place] Phone: [phone] Relation: Spouse Secondary Emergency Contact: Sherolyn, Pearson Mobile Phone: [phone] Relation: Other  Code Status:  FULL CODE Goals of care: Advanced Directive information Advanced Directives 10/06/2020  Does Patient Have a Medical Advance Directive? Yes  Type of Paramedic of [place];Living will  Does patient want to make changes to medical advance directive? No - Patient declined  Copy of [place] in Chart? Yes - validated most recent copy scanned in chart (See row information)     Chief Complaint  Patient presents with   Medical Management of Chronic Issues    Routine follow up visit.    HPI:  Pt is a 75 y.o. female seen today for medical management of chronic diseases.   Alzheimer's dementia, takes Donepezil, Memantine was increased to '10mg'$  bid 09/23/20,  no CT/MRI of brain located. F/u [facility] memory care center, MMSE 17/30. TSH 1.87 06/17/20.              Hyperlipidemia, takes Rosuvastatin, LDL 86 06/17/20             Depression/anxiety, takes Escitalopram Seroquel, Depakote, Clonazepam-did not tolerate GDR prn for restlessness, angry spells, and agitation.              Vit B12 deficiency, takes Vit B 12 '3000mg'$  qd, Vit B12 level 551 06/17/20. Hgb 13.9 08/20/20             Vit D deficiency, takes Vit 2000 u daily             OP, takes Alendronate(started 2019 after foot fx) needs DEXA  Dysphagia, diet modification, speech therapy.    Past Medical History:  Diagnosis Date   Abnormal weight loss    Age-related osteoporosis without current pathological fracture    Allergic rhinitis     Atherosclerosis of aorta (HCC)    Chronic obstructive pulmonary disease (COPD) (HCC)    Corns and callosities    Depression    H/O mammogram 2021   Per [place] New Patient Packet   Hyperlipemia    Hyperlipidemia    Melanocytic nevi, unspecified    Mild cognitive impairment, so stated    Osteopenia    Osteoporosis    Other specified disorders of bone density and structure, unspecified site    Pain in unspecified ankle and joints of unspecified foot    Past Surgical History:  Procedure Laterality Date   COLONOSCOPY  2020   Per [facility] New Patient Packet    Allergies  Allergen Reactions   Sertraline Other (See Comments)    Suicidal thoughts   Penicillin G Rash    Welts    Allergies as of 10/06/2020       Reactions   Sertraline Other (See Comments)   Suicidal thoughts   Penicillin G Rash   Welts        Medication List        Accurate as of October 06, 2020 11:59 PM. If you have any questions, ask your nurse or doctor.          acetaminophen 325 MG tablet Commonly known as: TYLENOL Take 650 mg by mouth 2 (two) times  daily as needed.   alendronate 70 MG tablet Commonly known as: FOSAMAX Take 70 mg by mouth once a week. Take with a full glass of water on an empty stomach.   calcium carbonate 1500 (600 Ca) MG Tabs tablet Commonly known as: OSCAL Take 600 mg by mouth 2 (two) times daily with a meal.   clonazePAM 0.5 MG tablet Commonly known as: KLONOPIN Take 1 tablet (0.5 mg total) by mouth 2 (two) times daily. What changed: how much to take   CYANOCOBALAMIN PO Take 3,000 mcg by mouth daily.   divalproex 125 MG DR tablet Commonly known as: DEPAKOTE Take 125 mg by mouth 3 (three) times daily.   donepezil 5 MG tablet Commonly known as: ARICEPT   escitalopram 10 MG tablet Commonly known as: LEXAPRO Take 10 mg by mouth daily.   memantine 5 MG tablet Commonly known as: NAMENDA Take 10 mg by mouth 2 (two) times daily.   multivitamin capsule Take by  mouth.   QUEtiapine 25 MG tablet Commonly known as: SEROQUEL Take 25 mg by mouth 2 (two) times daily.   rosuvastatin 10 MG tablet Commonly known as: CRESTOR Take 10 mg by mouth daily.   Vitamin D 50 MCG (2000 UT) tablet Take 2,000 Units by mouth daily.        Review of Systems  Constitutional:  Negative for fatigue, fever and unexpected weight change.       Gradual weight gained.   HENT:  Negative for congestion, hearing loss and trouble swallowing.   Eyes:  Negative for visual disturbance.  Respiratory:  Negative for shortness of breath.   Cardiovascular:  Negative for leg swelling.  Gastrointestinal:  Negative for abdominal pain and constipation.  Genitourinary:  Negative for dysuria, frequency and urgency.  Musculoskeletal:  Positive for arthralgias and joint swelling. Negative for gait problem.  Skin:  Negative for color change.  Neurological:  Negative for speech difficulty, weakness and light-headedness.       Memory lapses.   Psychiatric/Behavioral:  Positive for agitation, behavioral problems, confusion and sleep disturbance. The patient is nervous/anxious.        Less episodes of the patient's anxious episodes, early am awake, wandering, restlessness, angry spells   Immunization History  Administered Date(s) Administered   Influenza Inj Mdck Quad With Preservative 10/11/2015   Influenza, High Dose Seasonal PF 11/21/2013   Influenza, Quadrivalent, Recombinant, Inj, Pf 10/01/2017, 10/12/2018, 10/27/2019   Influenza-Unspecified 11/22/2014   PFIZER(Purple Top)SARS-COV-2 Vaccination 02/16/2019, 03/09/2019, 10/26/2019, 06/24/2020   PPD Test 10/23/2005   Pneumococcal Conjugate-13 08/08/2013   Pneumococcal Polysaccharide-23 12/27/2011, 01/25/2018   Tdap 11/28/2009   Zoster, Live 03/15/2005   Zoster, Unspecified 09/01/2016, 12/02/2016   Pertinent  Health Maintenance Due  Topic Date Due   DEXA SCAN  Never done   INFLUENZA VACCINE  08/25/2020   PNA vac Low Risk Adult   Completed   No flowsheet data found. Functional Status Survey:    Vitals:   10/06/20 1051  BP: 116/72  Pulse: 66  Resp: 18  Temp: 97.7 F (36.5 C)  SpO2: 96%  Weight: 116 lb 6.4 oz (52.8 kg)  Height: '5\' 5"'$  (1.651 m)   Body mass index is 19.37 kg/m. Physical Exam Vitals and nursing note reviewed.  Constitutional:      Appearance: Normal appearance.  HENT:     Head: Normocephalic and atraumatic.     Mouth/Throat:     Mouth: Mucous membranes are moist.  Eyes:     Extraocular Movements: Extraocular movements  intact.     Conjunctiva/sclera: Conjunctivae normal.     Pupils: Pupils are equal, round, and reactive to light.  Cardiovascular:     Rate and Rhythm: Normal rate and regular rhythm.     Heart sounds: No murmur heard. Pulmonary:     Effort: Pulmonary effort is normal.     Breath sounds: No rales.  Abdominal:     General: Bowel sounds are normal.     Palpations: Abdomen is soft.     Tenderness: There is no abdominal tenderness.  Musculoskeletal:     Cervical back: Normal range of motion and neck supple.     Right lower leg: No edema.     Left lower leg: No edema.  Skin:    General: Skin is warm and dry.  Neurological:     General: No focal deficit present.     Mental Status: She is alert. Mental status is at baseline.     Gait: Gait normal.     Comments: Oriented to person  Psychiatric:        Mood and Affect: Mood normal.     Comments: Smile, followed simple directions.     Labs reviewed: Recent Labs    06/18/20 0000 07/16/20 0000 08/20/20 0000  NA 136* 136* 138  K 4.1 4.5 4.1  CL 100 98* 99  CO2 29* 30* 29*  BUN '12  12 14 14  '$ CREATININE 0.8  0.8 0.7 0.7  CALCIUM 9.3 9.6 9.4   Recent Labs    06/18/20 0000 07/16/20 0000 08/20/20 0000  AST '23 29 22  '$ ALT '18 26 20  '$ ALKPHOS 59 69 57  ALBUMIN 4.2 4.3 3.7   Recent Labs    06/18/20 0000 07/16/20 0000 08/20/20 0000  WBC 5.9 7.6 7.6  NEUTROABS 2,903.00 3,762.00 3,595.00  HGB 15.0 15.0  13.9  HCT 45 46 41  PLT 337 388 294   Lab Results  Component Value Date   TSH 1.87 06/18/2020   No results found for: HGBA1C Lab Results  Component Value Date   CHOL 178 06/18/2020   HDL 77 (A) 06/18/2020   LDLCALC 86 06/18/2020   TRIG 61 06/18/2020    Significant Diagnostic Results in last 30 days:  No results found.  Assessment/Plan Dysphagia 09/19/20 ST thin liquid, regular diet.   Osteoporosis takes Alendronate(started 2019 after foot fx) needs DEXA  Vitamin D deficiency takes Vit 2000 u daily  Vitamin B12 deficiency takes Vit B 12 '3000mg'$  qd, Vit B12 level 551 06/17/20. Hgb 13.9 08/20/20  Depression with anxiety  takes Escitalopram Seroquel, Depakote, Clonazepam-did not tolerate GDR prn for restlessness, angry spells, and agitation.   Hyperlipidemia  takes Rosuvastatin, LDL 86 06/17/20  Alzheimer disease ([place]) Alzheimer's dementia, takes Donepezil, Memantine was increased to '10mg'$  bid 09/23/20,  no CT/MRI of brain located. F/u [facility] memory care center, MMSE 17/30. TSH 1.87 06/17/20.     Family/ staff Communication: plan of care reviewed with the patient and charge nurse.   Labs/tests ordered:  none  Time spend 35 minutes.

## 2020-10-06 NOTE — Assessment & Plan Note (Signed)
takes Escitalopram Seroquel, Depakote, Clonazepam-did not tolerate GDR prn for restlessness, angry spells, and agitation.

## 2020-10-06 NOTE — Assessment & Plan Note (Signed)
takes Vit B 12 3000mg  qd, Vit B12 level 551 06/17/20. Hgb 13.9 08/20/20

## 2020-10-06 NOTE — Assessment & Plan Note (Signed)
09/19/20 ST thin liquid, regular diet.

## 2020-10-06 NOTE — Assessment & Plan Note (Signed)
takes Rosuvastatin, LDL 86 06/17/20

## 2020-10-06 NOTE — Assessment & Plan Note (Signed)
Alzheimer's dementia, takes Donepezil, Memantine was increased to '10mg'$  bid 09/23/20,  no CT/MRI of brain located. F/u [facility] memory care center, MMSE 17/30. TSH 1.87 06/17/20.

## 2020-10-07 ENCOUNTER — Encounter: Payer: Self-pay | Admitting: Nurse Practitioner

## 2020-10-07 DIAGNOSIS — R1310 Dysphagia, unspecified: Secondary | ICD-10-CM | POA: Diagnosis not present

## 2020-10-07 DIAGNOSIS — R1313 Dysphagia, pharyngeal phase: Secondary | ICD-10-CM | POA: Diagnosis not present

## 2020-10-08 DIAGNOSIS — R1310 Dysphagia, unspecified: Secondary | ICD-10-CM | POA: Diagnosis not present

## 2020-10-08 DIAGNOSIS — R1313 Dysphagia, pharyngeal phase: Secondary | ICD-10-CM | POA: Diagnosis not present

## 2020-10-09 DIAGNOSIS — F0390 Unspecified dementia without behavioral disturbance: Secondary | ICD-10-CM | POA: Diagnosis not present

## 2020-10-09 DIAGNOSIS — F419 Anxiety disorder, unspecified: Secondary | ICD-10-CM | POA: Diagnosis not present

## 2020-10-13 DIAGNOSIS — R1313 Dysphagia, pharyngeal phase: Secondary | ICD-10-CM | POA: Diagnosis not present

## 2020-10-13 DIAGNOSIS — R1310 Dysphagia, unspecified: Secondary | ICD-10-CM | POA: Diagnosis not present

## 2020-10-14 DIAGNOSIS — R1310 Dysphagia, unspecified: Secondary | ICD-10-CM | POA: Diagnosis not present

## 2020-10-14 DIAGNOSIS — R1313 Dysphagia, pharyngeal phase: Secondary | ICD-10-CM | POA: Diagnosis not present

## 2020-10-17 DIAGNOSIS — R1313 Dysphagia, pharyngeal phase: Secondary | ICD-10-CM | POA: Diagnosis not present

## 2020-10-17 DIAGNOSIS — R1310 Dysphagia, unspecified: Secondary | ICD-10-CM | POA: Diagnosis not present

## 2020-10-20 DIAGNOSIS — R1310 Dysphagia, unspecified: Secondary | ICD-10-CM | POA: Diagnosis not present

## 2020-10-20 DIAGNOSIS — R1313 Dysphagia, pharyngeal phase: Secondary | ICD-10-CM | POA: Diagnosis not present

## 2020-10-22 DIAGNOSIS — R1313 Dysphagia, pharyngeal phase: Secondary | ICD-10-CM | POA: Diagnosis not present

## 2020-10-22 DIAGNOSIS — R1310 Dysphagia, unspecified: Secondary | ICD-10-CM | POA: Diagnosis not present

## 2020-10-23 DIAGNOSIS — R1310 Dysphagia, unspecified: Secondary | ICD-10-CM | POA: Diagnosis not present

## 2020-10-23 DIAGNOSIS — R1313 Dysphagia, pharyngeal phase: Secondary | ICD-10-CM | POA: Diagnosis not present

## 2020-10-27 DIAGNOSIS — R1313 Dysphagia, pharyngeal phase: Secondary | ICD-10-CM | POA: Diagnosis not present

## 2020-10-27 DIAGNOSIS — R1310 Dysphagia, unspecified: Secondary | ICD-10-CM | POA: Diagnosis not present

## 2020-10-28 DIAGNOSIS — R1313 Dysphagia, pharyngeal phase: Secondary | ICD-10-CM | POA: Diagnosis not present

## 2020-10-28 DIAGNOSIS — R1310 Dysphagia, unspecified: Secondary | ICD-10-CM | POA: Diagnosis not present

## 2020-10-30 DIAGNOSIS — R1313 Dysphagia, pharyngeal phase: Secondary | ICD-10-CM | POA: Diagnosis not present

## 2020-10-30 DIAGNOSIS — R1310 Dysphagia, unspecified: Secondary | ICD-10-CM | POA: Diagnosis not present

## 2020-10-31 ENCOUNTER — Other Ambulatory Visit: Payer: Self-pay | Admitting: *Deleted

## 2020-10-31 MED ORDER — CLONAZEPAM 0.5 MG PO TABS: 0.5000 mg | ORAL_TABLET | Freq: Two times a day (BID) | ORAL | 0 refills | Status: DC

## 2020-10-31 NOTE — Telephone Encounter (Signed)
[facility] Requested refill.  Pended Rx and sent to [facility] for approval.

## 2020-11-03 DIAGNOSIS — R1313 Dysphagia, pharyngeal phase: Secondary | ICD-10-CM | POA: Diagnosis not present

## 2020-11-03 DIAGNOSIS — R1310 Dysphagia, unspecified: Secondary | ICD-10-CM | POA: Diagnosis not present

## 2020-11-04 ENCOUNTER — Encounter: Payer: Self-pay | Admitting: Internal Medicine

## 2020-11-04 ENCOUNTER — Non-Acute Institutional Stay (SKILLED_NURSING_FACILITY): Payer: Medicare PPO | Admitting: Internal Medicine

## 2020-11-04 DIAGNOSIS — R1313 Dysphagia, pharyngeal phase: Secondary | ICD-10-CM | POA: Diagnosis not present

## 2020-11-04 DIAGNOSIS — R1312 Dysphagia, oropharyngeal phase: Secondary | ICD-10-CM

## 2020-11-04 DIAGNOSIS — F418 Other specified anxiety disorders: Secondary | ICD-10-CM | POA: Diagnosis not present

## 2020-11-04 DIAGNOSIS — G309 Alzheimer's disease, unspecified: Secondary | ICD-10-CM

## 2020-11-04 DIAGNOSIS — R1310 Dysphagia, unspecified: Secondary | ICD-10-CM | POA: Diagnosis not present

## 2020-11-04 DIAGNOSIS — F0280 Dementia in other diseases classified elsewhere without behavioral disturbance: Secondary | ICD-10-CM | POA: Diagnosis not present

## 2020-11-04 DIAGNOSIS — E785 Hyperlipidemia, unspecified: Secondary | ICD-10-CM | POA: Diagnosis not present

## 2020-11-04 DIAGNOSIS — E538 Deficiency of other specified B group vitamins: Secondary | ICD-10-CM

## 2020-11-04 DIAGNOSIS — M810 Age-related osteoporosis without current pathological fracture: Secondary | ICD-10-CM

## 2020-11-04 NOTE — Progress Notes (Signed)
Location:   [place] Room Number: 110 Place of Service:  SNF [phone]) Provider:  Veleta Miners MD  [name], MD  Patient Care Team: [name], MD as PCP - General (Internal Medicine)  Extended Emergency Contact Information Primary Emergency Contact: Pollyann Glen of [place] Phone: [phone] Relation: Spouse Secondary Emergency Contact: Avaeh, [place] Mobile Phone: [phone] Relation: Other  Code Status:  Full Code Managed Care Goals of care: Advanced Directive information Advanced Directives 11/04/2020  Does Patient Have a Medical Advance Directive? Yes  Type of Paramedic of [place];Living will  Does patient want to make changes to medical advance directive? No - Patient declined  Copy of [place] in Chart? Yes - validated most recent copy scanned in chart (See row information)     Chief Complaint  Patient presents with   Medical Management of Chronic Issues   Quality Metric Gaps    Hep C screen, Shingrix, Dexa scan, TDAP, Flu shot    HPI:  Pt is a 75 y.o. female seen today for medical management of chronic diseases.   And readmit in SNF  Patient has a history of Alzheimer's disease,, osteoporosis, vitamin D and B12 deficiency, hyperlipidemia, anxiety Before the admission patient Eloped her apartment and was found by the facility staff.  Her husband decided to move her to memory unit  Patient is staying stable  Gained weight Sometimes still tries to elope but responds to redirections Was very calm this morning  Had no new issues Participates in all activities She told me that her husband is at work and her kids are in School. Walks with no assist No falls  Past Medical History:  Diagnosis Date   Abnormal weight loss    Age-related osteoporosis without current pathological fracture    Allergic rhinitis    Atherosclerosis of aorta (HCC)    Chronic  obstructive pulmonary disease (COPD) (HCC)    Corns and callosities    Depression    H/O mammogram 2021   Per [place] New Patient Packet   Hyperlipemia    Hyperlipidemia    Melanocytic nevi, unspecified    Mild cognitive impairment, so stated    Osteopenia    Osteoporosis    Other specified disorders of bone density and structure, unspecified site    Pain in unspecified ankle and joints of unspecified foot    Past Surgical History:  Procedure Laterality Date   COLONOSCOPY  2020   Per [facility] Patient Packet    Allergies  Allergen Reactions   Sertraline Other (See Comments)    Suicidal thoughts   Penicillin G Rash    Welts    Allergies as of 11/04/2020       Reactions   Sertraline Other (See Comments)   Suicidal thoughts   Penicillin G Rash   Welts        Medication List        Accurate as of November 04, 2020 11:30 AM. If you have any questions, ask your nurse or doctor.          acetaminophen 325 MG tablet Commonly known as: TYLENOL Take 650 mg by mouth 2 (two) times daily as needed.   alendronate 70 MG tablet Commonly known as: FOSAMAX Take 70 mg by mouth once a week. Take with a full glass of water on an empty stomach.   calcium carbonate 1500 (600 Ca) MG Tabs tablet Commonly known as: OSCAL  Take 600 mg by mouth 2 (two) times daily with a meal.   clonazePAM 0.5 MG tablet Commonly known as: KLONOPIN Take 1 tablet (0.5 mg total) by mouth 2 (two) times daily.   CYANOCOBALAMIN PO Take 3,000 mcg by mouth daily.   divalproex 125 MG DR tablet Commonly known as: DEPAKOTE Take 125 mg by mouth 3 (three) times daily.   donepezil 5 MG tablet Commonly known as: ARICEPT   escitalopram 10 MG tablet Commonly known as: LEXAPRO Take 10 mg by mouth daily.   memantine 5 MG tablet Commonly known as: NAMENDA Take 10 mg by mouth 2 (two) times daily.   multivitamin capsule Take by mouth.   QUEtiapine 25 MG tablet Commonly known as: SEROQUEL Take 25 mg by  mouth 2 (two) times daily.   rosuvastatin 10 MG tablet Commonly known as: CRESTOR Take 10 mg by mouth daily.   Vitamin D 50 MCG (2000 UT) tablet Take 2,000 Units by mouth daily.        Review of Systems  Unable to perform ROS: Dementia   Immunization History  Administered Date(s) Administered   Influenza Inj Mdck Quad With Preservative 10/11/2015   Influenza, High Dose Seasonal PF 11/21/2013   Influenza, Quadrivalent, Recombinant, Inj, Pf 10/01/2017, 10/12/2018, 10/27/2019   Influenza-Unspecified 11/22/2014   PFIZER(Purple Top)SARS-COV-2 Vaccination 02/16/2019, 03/09/2019, 10/26/2019, 06/24/2020   PPD Test 10/23/2005   Pneumococcal Conjugate-13 08/08/2013   Pneumococcal Polysaccharide-23 12/27/2011, 01/25/2018   Tdap 11/28/2009   Zoster, Live 03/15/2005   Zoster, Unspecified 09/01/2016, 12/02/2016   Pertinent  Health Maintenance Due  Topic Date Due   DEXA SCAN  Never done   INFLUENZA VACCINE  08/25/2020   No flowsheet data found. Functional Status Survey:    Vitals:   11/04/20 1125  BP: 114/72  Pulse: 60  Resp: 18  Temp: 98.2 F (36.8 C)  SpO2: 98%  Weight: 120 lb 9.6 oz (54.7 kg)  Height: 5\' 5"  (1.651 m)   Body mass index is 20.07 kg/m. Physical Exam Vitals reviewed.  Constitutional:      Appearance: Normal appearance.  HENT:     Head: Normocephalic.     Nose: Nose normal.     Mouth/Throat:     Mouth: Mucous membranes are moist.     Pharynx: Oropharynx is clear.  Eyes:     Pupils: Pupils are equal, round, and reactive to light.  Cardiovascular:     Rate and Rhythm: Normal rate and regular rhythm.     Pulses: Normal pulses.     Heart sounds: Normal heart sounds.  Pulmonary:     Effort: Pulmonary effort is normal.     Breath sounds: Normal breath sounds.  Abdominal:     General: Abdomen is flat. Bowel sounds are normal.     Palpations: Abdomen is soft.  Musculoskeletal:        General: No swelling.     Cervical back: Neck supple.  Skin:     General: Skin is warm.  Neurological:     General: No focal deficit present.     Mental Status: She is alert.     Comments: Pleasantly Confused. Walks with no assist Follows simple Commands Does have some aphasia Oriented to herself  Psychiatric:        Mood and Affect: Mood normal.        Thought Content: Thought content normal.        Judgment: Judgment normal.    Labs reviewed: Recent Labs    06/18/20 0000  07/16/20 0000 08/20/20 0000  NA 136* 136* 138  K 4.1 4.5 4.1  CL 100 98* 99  CO2 29* 30* 29*  BUN 12  12 14 14   CREATININE 0.8  0.8 0.7 0.7  CALCIUM 9.3 9.6 9.4   Recent Labs    06/18/20 0000 07/16/20 0000 08/20/20 0000  AST 23 29 22   ALT 18 26 20   ALKPHOS 59 69 57  ALBUMIN 4.2 4.3 3.7   Recent Labs    06/18/20 0000 07/16/20 0000 08/20/20 0000  WBC 5.9 7.6 7.6  NEUTROABS 2,903.00 3,762.00 3,595.00  HGB 15.0 15.0 13.9  HCT 45 46 41  PLT 337 388 294   Lab Results  Component Value Date   TSH 1.87 06/18/2020   No results found for: HGBA1C Lab Results  Component Value Date   CHOL 178 06/18/2020   HDL 77 (A) 06/18/2020   LDLCALC 86 06/18/2020   TRIG 61 06/18/2020    Significant Diagnostic Results in last 30 days:  No results found.  Assessment/Plan Alzheimer disease ([place]) Unfortunately failed her Klonopin GDR Continue same does of Seroquel, Depakote and Klonopin Also on Namenda and Aricept Hyperlipidemia,  On Statin LDL Less then 100 Oropharyngeal dysphagia Works with Speech Osteoporosis,  Fosamax since 2019  Will repeat DEXA scan in 2023 Continue Vit D Vitamin B12 deficiency Continue supplement Depression with anxiety On Lexapro   Family/ staff Communication:   Labs/tests ordered:

## 2020-11-06 DIAGNOSIS — F419 Anxiety disorder, unspecified: Secondary | ICD-10-CM | POA: Diagnosis not present

## 2020-11-06 DIAGNOSIS — F0390 Unspecified dementia without behavioral disturbance: Secondary | ICD-10-CM | POA: Diagnosis not present

## 2020-11-06 DIAGNOSIS — R1310 Dysphagia, unspecified: Secondary | ICD-10-CM | POA: Diagnosis not present

## 2020-11-06 DIAGNOSIS — R1313 Dysphagia, pharyngeal phase: Secondary | ICD-10-CM | POA: Diagnosis not present

## 2020-11-10 DIAGNOSIS — R1313 Dysphagia, pharyngeal phase: Secondary | ICD-10-CM | POA: Diagnosis not present

## 2020-11-10 DIAGNOSIS — R1310 Dysphagia, unspecified: Secondary | ICD-10-CM | POA: Diagnosis not present

## 2020-12-01 ENCOUNTER — Non-Acute Institutional Stay (SKILLED_NURSING_FACILITY): Payer: Medicare PPO | Admitting: Nurse Practitioner

## 2020-12-01 ENCOUNTER — Encounter: Payer: Self-pay | Admitting: Nurse Practitioner

## 2020-12-01 DIAGNOSIS — F418 Other specified anxiety disorders: Secondary | ICD-10-CM | POA: Diagnosis not present

## 2020-12-01 DIAGNOSIS — E538 Deficiency of other specified B group vitamins: Secondary | ICD-10-CM | POA: Diagnosis not present

## 2020-12-01 DIAGNOSIS — R1312 Dysphagia, oropharyngeal phase: Secondary | ICD-10-CM | POA: Diagnosis not present

## 2020-12-01 DIAGNOSIS — M810 Age-related osteoporosis without current pathological fracture: Secondary | ICD-10-CM

## 2020-12-01 DIAGNOSIS — E559 Vitamin D deficiency, unspecified: Secondary | ICD-10-CM

## 2020-12-01 DIAGNOSIS — E785 Hyperlipidemia, unspecified: Secondary | ICD-10-CM

## 2020-12-01 DIAGNOSIS — R635 Abnormal weight gain: Secondary | ICD-10-CM | POA: Diagnosis not present

## 2020-12-01 DIAGNOSIS — F0280 Dementia in other diseases classified elsewhere without behavioral disturbance: Secondary | ICD-10-CM

## 2020-12-01 DIAGNOSIS — G309 Alzheimer's disease, unspecified: Secondary | ICD-10-CM

## 2020-12-01 NOTE — Assessment & Plan Note (Signed)
Gradual weight gained, about 3-5Ibs a month since 07/2020, no apparent fluid retention. Will update CBC/diff, CMP/eGFR, TSH, f/u dietary.

## 2020-12-01 NOTE — Assessment & Plan Note (Signed)
takes Vit B 12 3000mg  qd, Vit B12 level 551 06/17/20. Hgb 13.9 08/20/20

## 2020-12-01 NOTE — Progress Notes (Signed)
Location:   [facility] Room Number: 110 A Place of Service:  SNF (31) Provider:  Kyi Romanello X, NP  [name], MD  Patient Care Team: [name], MD as PCP - General (Internal Medicine)  Extended Emergency Contact Information Primary Emergency Contact: [place] of [place] Phone: [phone] Relation: Spouse Secondary Emergency Contact: Naraya, [place] Mobile Phone: [phone] Relation: Other  Code Status:  FULL CODE Goals of care: Advanced Directive information Advanced Directives 12/01/2020  Does Patient Have a Medical Advance Directive? Yes  Type of Paramedic of [place];Living will  Does patient want to make changes to medical advance directive? No - Patient declined  Copy of [place] in Chart? Yes - validated most recent copy scanned in chart (See row information)     Chief Complaint  Patient presents with   Medical Management of Chronic Issues    Routine visit   Quality Metric Gaps    Hep C, Shingrix, DEXA, TDAP,     HPI:  Pt is a 75 y.o. female seen today for medical management of chronic diseases.     Alzheimer's dementia, takes Donepezil, Memantine,  no CT/MRI of brain located. F/u [facility] memory care center, MMSE 17/30. TSH 1.87 06/17/20.              Hyperlipidemia, takes Rosuvastatin, LDL 86 06/17/20             Depression/anxiety, takes Escitalopram Seroquel, Depakote, Clonaz, aepam-did not tolerate GDR prn-relapsed restlessness, angry spells and agitation.              Vit B12 deficiency, takes Vit B 12 $Re'3000mg'cMJ$  qd, Vit B12 level 551 06/17/20. Hgb 13.9 08/20/20             Vit D deficiency, takes Vit D             OP, takes Alendronate(started 2019 after foot fx) needs DEXA 2023             Dysphagia, diet modification, speech therapy.     Past Medical History:  Diagnosis Date   Abnormal weight loss    Age-related osteoporosis without current pathological  fracture    Allergic rhinitis    Atherosclerosis of aorta (HCC)    Chronic obstructive pulmonary disease (COPD) (HCC)    Corns and callosities    Depression    H/O mammogram 2021   Per [facility] New Patient Packet   Hyperlipemia    Hyperlipidemia    Melanocytic nevi, unspecified    Mild cognitive impairment, so stated    Osteopenia    Osteoporosis    Other specified disorders of bone density and structure, unspecified site    Pain in unspecified ankle and joints of unspecified foot    Past Surgical History:  Procedure Laterality Date   COLONOSCOPY  2020   Per [facility] New Patient Packet    Allergies  Allergen Reactions   Sertraline Other (See Comments)    Suicidal thoughts   Penicillin G Rash    Welts    Allergies as of 12/01/2020       Reactions   Sertraline Other (See Comments)   Suicidal thoughts   Penicillin G Rash   Welts        Medication List        Accurate as of December 01, 2020 11:59 PM. If you have any questions, ask your nurse or doctor.  acetaminophen 325 MG tablet Commonly known as: TYLENOL Take 650 mg by mouth 2 (two) times daily as needed.   alendronate 70 MG tablet Commonly known as: FOSAMAX Take 70 mg by mouth once a week. Take with a full glass of water on an empty stomach.   calcium carbonate 1500 (600 Ca) MG Tabs tablet Commonly known as: OSCAL Take 600 mg by mouth 2 (two) times daily with a meal.   clonazePAM 0.5 MG tablet Commonly known as: KLONOPIN Take 1 tablet (0.5 mg total) by mouth 2 (two) times daily.   CYANOCOBALAMIN PO Take 3,000 mcg by mouth daily.   divalproex 125 MG DR tablet Commonly known as: DEPAKOTE Take 125 mg by mouth 3 (three) times daily.   donepezil 5 MG tablet Commonly known as: ARICEPT   escitalopram 10 MG tablet Commonly known as: LEXAPRO Take 10 mg by mouth daily.   memantine 5 MG tablet Commonly known as: NAMENDA Take 10 mg by mouth 2 (two) times daily.   multivitamin capsule Take by  mouth daily. 400 mcg; amt: 1Tablet   QUEtiapine 25 MG tablet Commonly known as: SEROQUEL Take 25 mg by mouth 2 (two) times daily.   rosuvastatin 10 MG tablet Commonly known as: CRESTOR Take 10 mg by mouth daily.   Vitamin D 50 MCG (2000 UT) tablet Take 2,000 Units by mouth daily.        Review of Systems  Constitutional:  Negative for fatigue, fever and unexpected weight change.       Gradual weight gained.   HENT:  Negative for congestion, hearing loss and trouble swallowing.   Eyes:  Negative for visual disturbance.  Respiratory:  Negative for shortness of breath.   Cardiovascular:  Negative for leg swelling.  Gastrointestinal:  Negative for abdominal pain and constipation.  Genitourinary:  Negative for dysuria, frequency and urgency.  Musculoskeletal:  Positive for arthralgias. Negative for gait problem.  Skin:  Negative for color change.  Neurological:  Negative for speech difficulty, weakness and light-headedness.       Memory lapses.   Psychiatric/Behavioral:  Positive for agitation, behavioral problems, confusion and sleep disturbance. The patient is nervous/anxious.        Less emotional/behavioral outbursts.    Immunization History  Administered Date(s) Administered   Influenza Inj Mdck Quad With Preservative 10/11/2015   Influenza, High Dose Seasonal PF 11/21/2013   Influenza, Quadrivalent, Recombinant, Inj, Pf 10/01/2017, 10/12/2018, 10/27/2019   Influenza-Unspecified 11/22/2014, 11/12/2020   PFIZER(Purple Top)SARS-COV-2 Vaccination 02/16/2019, 03/09/2019, 10/26/2019, 06/24/2020, 10/14/2020   PPD Test 10/23/2005   Pneumococcal Conjugate-13 08/08/2013   Pneumococcal Polysaccharide-23 12/27/2011, 01/25/2018   Tdap 11/28/2009   Zoster, Live 03/15/2005   Zoster, Unspecified 09/01/2016, 12/02/2016   Pertinent  Health Maintenance Due  Topic Date Due   DEXA SCAN  Never done   INFLUENZA VACCINE  Completed   No flowsheet data found. Functional Status Survey:     Vitals:   12/01/20 1407  BP: (!) 145/68  Pulse: 65  Resp: 16  Temp: 98.4 F (36.9 C)  SpO2: 97%  Weight: 124 lb 3.2 oz (56.3 kg)  Height: $Remove'5\' 4"'cfcZxXI$  (1.626 m)   Body mass index is 21.32 kg/m. Physical Exam Vitals and nursing note reviewed.  Constitutional:      Appearance: Normal appearance.  HENT:     Head: Normocephalic and atraumatic.     Mouth/Throat:     Mouth: Mucous membranes are moist.  Eyes:     Extraocular Movements: Extraocular movements intact.  Conjunctiva/sclera: Conjunctivae normal.     Pupils: Pupils are equal, round, and reactive to light.  Cardiovascular:     Rate and Rhythm: Normal rate and regular rhythm.     Heart sounds: No murmur heard. Pulmonary:     Effort: Pulmonary effort is normal.     Breath sounds: No rales.  Abdominal:     General: Bowel sounds are normal.     Palpations: Abdomen is soft.     Tenderness: There is no abdominal tenderness.  Musculoskeletal:     Cervical back: Normal range of motion and neck supple.     Right lower leg: No edema.     Left lower leg: No edema.  Skin:    General: Skin is warm and dry.  Neurological:     General: No focal deficit present.     Mental Status: She is alert. Mental status is at baseline.     Gait: Gait normal.     Comments: Oriented to person  Psychiatric:        Mood and Affect: Mood normal.     Comments: Smile, followed simple directions.     Labs reviewed: Recent Labs    06/18/20 0000 07/16/20 0000 08/20/20 0000  NA 136* 136* 138  K 4.1 4.5 4.1  CL 100 98* 99  CO2 29* 30* 29*  BUN $Re'12  12 14 14  'Uve$ CREATININE 0.8  0.8 0.7 0.7  CALCIUM 9.3 9.6 9.4   Recent Labs    06/18/20 0000 07/16/20 0000 08/20/20 0000  AST $Re'23 29 22  'oCB$ ALT $R'18 26 20  'nI$ ALKPHOS 59 69 57  ALBUMIN 4.2 4.3 3.7   Recent Labs    06/18/20 0000 07/16/20 0000 08/20/20 0000  WBC 5.9 7.6 7.6  NEUTROABS 2,903.00 3,762.00 3,595.00  HGB 15.0 15.0 13.9  HCT 45 46 41  PLT 337 388 294   Lab Results  Component  Value Date   TSH 1.87 06/18/2020   No results found for: HGBA1C Lab Results  Component Value Date   CHOL 178 06/18/2020   HDL 77 (A) 06/18/2020   LDLCALC 86 06/18/2020   TRIG 61 06/18/2020    Significant Diagnostic Results in last 30 days:  No results found.  Assessment/Plan Vitamin B12 deficiency  takes Vit B 12 $Re'3000mg'wiN$  qd, Vit B12 level 551 06/17/20. Hgb 13.9 08/20/20  Vitamin D deficiency takes Vit D  Osteoporosis  takes Alendronate(started 2019 after foot fx) needs DEXA 2023  Dysphagia diet modification, speech therapy.   Hyperlipidemia takes Rosuvastatin, LDL 86 06/17/20  Depression with anxiety takes Escitalopram Seroquel, Depakote, Clonaz, aepam-did not tolerate GDR prn-relapsed restlessness, angry spells and agitation.   Alzheimer disease ([place]) Resides in memory care unit [facility], takes Donepezil, Memantine,  no CT/MRI of brain located. F/u [facility] memory care center, MMSE 17/30. TSH 1.87 06/17/20.   Weight gain Gradual weight gained, about 3-5Ibs a month since 07/2020, no apparent fluid retention. Will update CBC/diff, CMP/eGFR, TSH, f/u dietary.     Family/ staff Communication: plan of care reviewed with the patient and charge nurse.   Labs/tests ordered:  CBC/diff, CMP/eGFR, TSH   Time spend 35 minutes.

## 2020-12-01 NOTE — Assessment & Plan Note (Signed)
takes Escitalopram Seroquel, Depakote, Clonaz, aepam-did not tolerate GDR prn-relapsed restlessness, angry spells and agitation.

## 2020-12-01 NOTE — Assessment & Plan Note (Signed)
Resides in memory care unit [facility], takes Donepezil, Memantine,  no CT/MRI of brain located. F/u [facility] memory care center, MMSE 17/30. TSH 1.87 06/17/20.

## 2020-12-01 NOTE — Assessment & Plan Note (Signed)
takes Alendronate(started 2019 after foot fx) needs DEXA 2023

## 2020-12-01 NOTE — Assessment & Plan Note (Signed)
diet modification, speech therapy.

## 2020-12-01 NOTE — Assessment & Plan Note (Signed)
takes Vit D 

## 2020-12-01 NOTE — Assessment & Plan Note (Signed)
takes Rosuvastatin, LDL 86 06/17/20

## 2020-12-02 ENCOUNTER — Encounter: Payer: Self-pay | Admitting: Nurse Practitioner

## 2020-12-02 ENCOUNTER — Other Ambulatory Visit: Payer: Self-pay | Admitting: *Deleted

## 2020-12-02 DIAGNOSIS — I10 Essential (primary) hypertension: Secondary | ICD-10-CM | POA: Diagnosis not present

## 2020-12-02 DIAGNOSIS — E785 Hyperlipidemia, unspecified: Secondary | ICD-10-CM | POA: Diagnosis not present

## 2020-12-02 DIAGNOSIS — E039 Hypothyroidism, unspecified: Secondary | ICD-10-CM | POA: Diagnosis not present

## 2020-12-02 MED ORDER — CLONAZEPAM 0.5 MG PO TABS: 0.5000 mg | ORAL_TABLET | Freq: Two times a day (BID) | ORAL | 0 refills | Status: DC

## 2020-12-02 NOTE — Telephone Encounter (Signed)
[facility] Requested refill. Pended Rx and sent to [facility] for approval.

## 2020-12-03 LAB — BASIC METABOLIC PANEL
BUN: 16 (ref 4–21)
CO2: 24 — AB (ref 13–22)
Chloride: 101 (ref 99–108)
Creatinine: 0.7 (ref 0.5–1.1)
Glucose: 87
Potassium: 4.4 (ref 3.4–5.3)
Sodium: 140 (ref 137–147)

## 2020-12-03 LAB — CBC AND DIFFERENTIAL
HCT: 44 (ref 36–46)
Hemoglobin: 14.7 (ref 12.0–16.0)
Neutrophils Absolute: 3811
Platelets: 276 (ref 150–399)
WBC: 7.3

## 2020-12-03 LAB — HEPATIC FUNCTION PANEL
ALT: 19 (ref 7–35)
AST: 23 (ref 13–35)
Alkaline Phosphatase: 71 (ref 25–125)
Bilirubin, Total: 0.5

## 2020-12-03 LAB — COMPREHENSIVE METABOLIC PANEL
Albumin: 4.1 (ref 3.5–5.0)
Calcium: 9.9 (ref 8.7–10.7)
Globulin: 2.5

## 2020-12-03 LAB — TSH: TSH: 2.04 (ref 0.41–5.90)

## 2020-12-03 LAB — CBC: RBC: 4.5 (ref 3.87–5.11)

## 2020-12-04 DIAGNOSIS — I10 Essential (primary) hypertension: Secondary | ICD-10-CM | POA: Diagnosis not present

## 2020-12-04 DIAGNOSIS — F411 Generalized anxiety disorder: Secondary | ICD-10-CM | POA: Diagnosis not present

## 2020-12-04 DIAGNOSIS — F02811 Dementia in other diseases classified elsewhere, unspecified severity, with agitation: Secondary | ICD-10-CM | POA: Diagnosis not present

## 2020-12-04 DIAGNOSIS — F02818 Dementia in other diseases classified elsewhere, unspecified severity, with other behavioral disturbance: Secondary | ICD-10-CM | POA: Diagnosis not present

## 2020-12-23 ENCOUNTER — Encounter: Payer: Self-pay | Admitting: Internal Medicine

## 2020-12-26 ENCOUNTER — Encounter: Payer: Self-pay | Admitting: Nurse Practitioner

## 2020-12-26 ENCOUNTER — Non-Acute Institutional Stay (SKILLED_NURSING_FACILITY): Payer: Medicare PPO | Admitting: Nurse Practitioner

## 2020-12-26 DIAGNOSIS — G309 Alzheimer's disease, unspecified: Secondary | ICD-10-CM

## 2020-12-26 DIAGNOSIS — M810 Age-related osteoporosis without current pathological fracture: Secondary | ICD-10-CM | POA: Diagnosis not present

## 2020-12-26 DIAGNOSIS — F418 Other specified anxiety disorders: Secondary | ICD-10-CM | POA: Diagnosis not present

## 2020-12-26 DIAGNOSIS — F0280 Dementia in other diseases classified elsewhere without behavioral disturbance: Secondary | ICD-10-CM

## 2020-12-26 DIAGNOSIS — E785 Hyperlipidemia, unspecified: Secondary | ICD-10-CM | POA: Diagnosis not present

## 2020-12-26 DIAGNOSIS — E559 Vitamin D deficiency, unspecified: Secondary | ICD-10-CM

## 2020-12-26 DIAGNOSIS — E538 Deficiency of other specified B group vitamins: Secondary | ICD-10-CM

## 2020-12-26 DIAGNOSIS — R1312 Dysphagia, oropharyngeal phase: Secondary | ICD-10-CM

## 2020-12-26 NOTE — Assessment & Plan Note (Signed)
Escitalopram Seroquel, Depakote, Clonazepam-did not tolerate GDR prn-relapsed restlessness, angry spells and agitation. valproic acid level 36 12/04/20 Continue prn Clonazepam, increase Depakote to 250mg  bid, continue Depakote 125mg  qd, Seroquel, Escitalopram.

## 2020-12-26 NOTE — Assessment & Plan Note (Signed)
,   takes Vit B 12 3000mg  qd, Vit B12 level 551 06/17/20.

## 2020-12-26 NOTE — Progress Notes (Signed)
Location:   SNF [place] Room Number: 110 Place of Service:  SNF (31) Provider: [facility] Osric Klopf NP  [name], MD  Patient Care Team: [name], MD as PCP - General (Internal Medicine)  Extended Emergency Contact Information Primary Emergency Contact: [place] Phone: [phone] Relation: Spouse Secondary Emergency Contact: [place], [place] Mobile Phone: [phone] Relation: Other  Code Status:  DNR Goals of care: Advanced Directive information Advanced Directives 12/26/2020  Does Patient Have a Medical Advance Directive? Yes  Type of Paramedic of [place];Living will  Does patient want to make changes to medical advance directive? No - Patient declined  Copy of [place] in Chart? Yes - validated most recent copy scanned in chart (See row information)     Chief Complaint  Patient presents with   Medical Management of Chronic Issues   Quality Metric Gaps    Hepatitis C Screening (Once)    Zoster Vaccines- Shingrix (1 of 2)  DEXA SCAN (Once) 2021 TETANUS/TDAP      HPI:  Pt is a 75 y.o. female seen today for medical management of chronic diseases.     Alzheimer's dementia, takes Donepezil, Memantine,  no CT/MRI of brain located. F/u [facility] memory care center, MMSE 17/30. TSH 1.87 06/17/20.              Hyperlipidemia, takes Rosuvastatin, LDL 86 06/17/20             Depression/anxiety, takes Escitalopram Seroquel, Depakote, Clonazepam-did not tolerate GDR prn-relapsed restlessness, angry spells and agitation. valproic acid 36 12/04/20             Vit B12 deficiency, takes Vit B 12 3000mg  qd, Vit B12 level 551 06/17/20.              Vit D deficiency, takes Vit D             OP, takes Alendronate(started 2019 after foot fx) needs DEXA 2023             Dysphagia, diet modification, speech therapy.  Past Medical History:  Diagnosis Date   Abnormal weight loss    Age-related  osteoporosis without current pathological fracture    Allergic rhinitis    Atherosclerosis of aorta (HCC)    Chronic obstructive pulmonary disease (COPD) (HCC)    Corns and callosities    Depression    H/O mammogram 2021   Per [facility] New Patient Packet   Hyperlipemia    Hyperlipidemia    Melanocytic nevi, unspecified    Mild cognitive impairment, so stated    Osteopenia    Osteoporosis    Other specified disorders of bone density and structure, unspecified site    Pain in unspecified ankle and joints of unspecified foot    Past Surgical History:  Procedure Laterality Date   COLONOSCOPY  2020   Per [place] Patient Packet    Allergies  Allergen Reactions   Sertraline Other (See Comments)    Suicidal thoughts   Penicillin G Rash    Welts    Allergies as of 12/26/2020       Reactions   Sertraline Other (See Comments)   Suicidal thoughts   Penicillin G Rash   Welts        Medication List        Accurate as of December 26, 2020 11:59 PM. If you have any questions, ask your nurse or doctor.  acetaminophen 325 MG tablet Commonly known as: TYLENOL Take 650 mg by mouth 2 (two) times daily as needed.   alendronate 70 MG tablet Commonly known as: FOSAMAX Take 70 mg by mouth once a week. Take with a full glass of water on an empty stomach.   calcium carbonate 1500 (600 Ca) MG Tabs tablet Commonly known as: OSCAL Take 600 mg by mouth 2 (two) times daily with a meal.   clonazePAM 0.5 MG tablet Commonly known as: KLONOPIN Take 1 tablet (0.5 mg total) by mouth 2 (two) times daily.   CYANOCOBALAMIN PO Take 3,000 mcg by mouth daily.   divalproex 125 MG DR tablet Commonly known as: DEPAKOTE Take 125 mg by mouth 3 (three) times daily.   donepezil 5 MG tablet Commonly known as: ARICEPT   escitalopram 10 MG tablet Commonly known as: LEXAPRO Take 10 mg by mouth daily.   memantine 5 MG tablet Commonly known as: NAMENDA Take 10 mg by mouth 2 (two) times  daily.   multivitamin capsule Take by mouth daily. 400 mcg; amt: 1Tablet   QUEtiapine 25 MG tablet Commonly known as: SEROQUEL Take 25 mg by mouth 2 (two) times daily.   rosuvastatin 10 MG tablet Commonly known as: CRESTOR Take 10 mg by mouth daily.   Vitamin D 50 MCG (2000 UT) tablet Take 2,000 Units by mouth daily.       The patient's husband present during today's visit.  Review of Systems  Constitutional:  Negative for activity change, appetite change and fever.       Gradual weight gained.   HENT:  Negative for congestion, hearing loss and trouble swallowing.   Eyes:  Negative for visual disturbance.  Respiratory:  Negative for shortness of breath.   Cardiovascular:  Negative for leg swelling.  Gastrointestinal:  Negative for abdominal pain and constipation.  Genitourinary:  Negative for dysuria, frequency and urgency.  Musculoskeletal:  Positive for arthralgias. Negative for gait problem.  Skin:  Negative for color change.  Neurological:  Negative for speech difficulty, weakness and light-headedness.       Memory lapses.   Psychiatric/Behavioral:  Positive for agitation, behavioral problems, confusion and sleep disturbance. Negative for suicidal ideas. The patient is nervous/anxious.        Relapsed emotional/behavioral outbursts.    Immunization History  Administered Date(s) Administered   Influenza Inj Mdck Quad With Preservative 10/11/2015   Influenza, High Dose Seasonal PF 11/21/2013   Influenza, Quadrivalent, Recombinant, Inj, Pf 10/01/2017, 10/12/2018, 10/27/2019   Influenza-Unspecified 11/22/2014, 11/12/2020   PFIZER(Purple Top)SARS-COV-2 Vaccination 02/16/2019, 03/09/2019, 10/26/2019, 06/24/2020, 10/14/2020   PPD Test 10/23/2005   Pneumococcal Conjugate-13 08/08/2013   Pneumococcal Polysaccharide-23 12/27/2011, 01/25/2018   Tdap 11/28/2009   Zoster, Live 03/15/2005   Zoster, Unspecified 09/01/2016, 12/02/2016   Pertinent  Health Maintenance Due  Topic  Date Due   DEXA SCAN  Never done   INFLUENZA VACCINE  Completed   No flowsheet data found. Functional Status Survey:    Vitals:   12/26/20 1612  BP: 108/68  Pulse: 72  Resp: 16  Temp: 98.2 F (36.8 C)  SpO2: 94%  Weight: 127 lb 9.6 oz (57.9 kg)  Height: 5\' 5"  (1.651 m)   Body mass index is 21.23 kg/m. Physical Exam Vitals and nursing note reviewed.  Constitutional:      Appearance: Normal appearance.  HENT:     Head: Normocephalic and atraumatic.     Mouth/Throat:     Mouth: Mucous membranes are moist.  Eyes:  Extraocular Movements: Extraocular movements intact.     Conjunctiva/sclera: Conjunctivae normal.     Pupils: Pupils are equal, round, and reactive to light.  Cardiovascular:     Rate and Rhythm: Normal rate and regular rhythm.     Heart sounds: No murmur heard. Pulmonary:     Effort: Pulmonary effort is normal.     Breath sounds: No rales.  Abdominal:     General: Bowel sounds are normal.     Palpations: Abdomen is soft.     Tenderness: There is no abdominal tenderness.  Musculoskeletal:     Cervical back: Normal range of motion and neck supple.     Right lower leg: No edema.     Left lower leg: No edema.  Skin:    General: Skin is warm and dry.  Neurological:     General: No focal deficit present.     Mental Status: She is alert. Mental status is at baseline.     Gait: Gait normal.     Comments: Oriented to person  Psychiatric:     Comments: Smile, followed simple directions during my examination today    Labs reviewed: Recent Labs    07/16/20 0000 08/20/20 0000 12/03/20 0000  NA 136* 138 140  K 4.5 4.1 4.4  CL 98* 99 101  CO2 30* 29* 24*  BUN 14 14 16   CREATININE 0.7 0.7 0.7  CALCIUM 9.6 9.4 9.9   Recent Labs    07/16/20 0000 08/20/20 0000 12/03/20 0000  AST 29 22 23   ALT 26 20 19   ALKPHOS 69 57 71  ALBUMIN 4.3 3.7 4.1   Recent Labs    07/16/20 0000 08/20/20 0000 12/03/20 0000  WBC 7.6 7.6 7.3  NEUTROABS 3,762.00  3,595.00 3,811.00  HGB 15.0 13.9 14.7  HCT 46 41 44  PLT 388 294 276   Lab Results  Component Value Date   TSH 2.04 12/03/2020   No results found for: HGBA1C Lab Results  Component Value Date   CHOL 178 06/18/2020   HDL 77 (A) 06/18/2020   LDLCALC 86 06/18/2020   TRIG 61 06/18/2020    Significant Diagnostic Results in last 30 days:  No results found.  Assessment/Plan  Depression with anxiety Escitalopram Seroquel, Depakote, Clonazepam-did not tolerate GDR prn-relapsed restlessness, angry spells and agitation. valproic acid level 36 12/04/20 Continue prn Clonazepam, increase Depakote to 250mg  bid, continue Depakote 125mg  qd, Seroquel, Escitalopram.   Vitamin D deficiency  takes Vit D  Vitamin B12 deficiency , takes Vit B 12 3000mg  qd, Vit B12 level 551 06/17/20.   Osteoporosis  takes Alendronate(started 2019 after foot fx) needs DEXA 2023  Dysphagia diet modification, speech therapy.   Alzheimer disease ([place])  takes Donepezil, Memantine,  no CT/MRI of brain located. F/u [facility] memory care center, MMSE 17/30. TSH 1.87 06/17/20.   Hyperlipidemia akes Rosuvastatin, LDL 86 06/17/20   Family/ staff Communication: plan of care reviewed with the patient and charge nurse.   Labs/tests ordered:  none    Time spend 35 minutes.

## 2020-12-26 NOTE — Assessment & Plan Note (Signed)
akes Rosuvastatin, LDL 86 06/17/20

## 2020-12-26 NOTE — Assessment & Plan Note (Signed)
takes Alendronate(started 2019 after foot fx) needs DEXA 2023

## 2020-12-26 NOTE — Assessment & Plan Note (Signed)
diet modification, speech therapy.

## 2020-12-26 NOTE — Assessment & Plan Note (Signed)
takes Donepezil, Memantine,  no CT/MRI of brain located. F/u [facility] memory care center, MMSE 17/30. TSH 1.87 06/17/20.

## 2020-12-26 NOTE — Assessment & Plan Note (Signed)
takes Vit D 

## 2020-12-30 ENCOUNTER — Other Ambulatory Visit: Payer: Self-pay | Admitting: Internal Medicine

## 2020-12-30 ENCOUNTER — Encounter: Payer: Self-pay | Admitting: Nurse Practitioner

## 2020-12-30 DIAGNOSIS — Z1231 Encounter for screening mammogram for malignant neoplasm of breast: Secondary | ICD-10-CM

## 2021-01-02 DIAGNOSIS — F411 Generalized anxiety disorder: Secondary | ICD-10-CM | POA: Diagnosis not present

## 2021-01-02 DIAGNOSIS — G309 Alzheimer's disease, unspecified: Secondary | ICD-10-CM | POA: Diagnosis not present

## 2021-01-20 ENCOUNTER — Encounter: Payer: Self-pay | Admitting: Nurse Practitioner

## 2021-01-20 ENCOUNTER — Non-Acute Institutional Stay (SKILLED_NURSING_FACILITY): Payer: Medicare PPO | Admitting: Nurse Practitioner

## 2021-01-20 DIAGNOSIS — F418 Other specified anxiety disorders: Secondary | ICD-10-CM

## 2021-01-20 DIAGNOSIS — E559 Vitamin D deficiency, unspecified: Secondary | ICD-10-CM | POA: Diagnosis not present

## 2021-01-20 DIAGNOSIS — G309 Alzheimer's disease, unspecified: Secondary | ICD-10-CM

## 2021-01-20 DIAGNOSIS — E538 Deficiency of other specified B group vitamins: Secondary | ICD-10-CM

## 2021-01-20 DIAGNOSIS — M810 Age-related osteoporosis without current pathological fracture: Secondary | ICD-10-CM

## 2021-01-20 DIAGNOSIS — R1312 Dysphagia, oropharyngeal phase: Secondary | ICD-10-CM

## 2021-01-20 DIAGNOSIS — L309 Dermatitis, unspecified: Secondary | ICD-10-CM | POA: Diagnosis not present

## 2021-01-20 DIAGNOSIS — E785 Hyperlipidemia, unspecified: Secondary | ICD-10-CM

## 2021-01-20 DIAGNOSIS — F0280 Dementia in other diseases classified elsewhere without behavioral disturbance: Secondary | ICD-10-CM

## 2021-01-20 NOTE — Progress Notes (Signed)
Location:   SNF [place] Room Number: 110 Place of Service:  SNF (31) Provider: [facility] Eulala Newcombe NP  [name], MD  Patient Care Team: [name], MD as PCP - General (Internal Medicine)  Extended Emergency Contact Information Primary Emergency Contact: Bertram [place] of [place] Phone: [phone] Relation: Spouse Secondary Emergency Contact: Varonica, Siharath Mobile Phone: [phone] Relation: Other  Code Status: DNR Goals of care: Advanced Directive information Advanced Directives 01/20/2021  Does Patient Have a Medical Advance Directive? Yes  Type of Paramedic of [place];Living will  Does patient want to make changes to medical advance directive? No - Patient declined  Copy of [place] in Chart? Yes - validated most recent copy scanned in chart (See row information)     Chief Complaint  Patient presents with   Acute Visit    Rash left thigh, right calf    HPI:  Pt is a 75 y.o. female seen today for an acute visit for reported itching rash left thigh, R calf. Note scratched marks, no apparent rash, the patient denied itching or pain during my examination.     Alzheimer's dementia, takes Donepezil, Memantine,  no CT/MRI of brain located. F/u [facility] memory care center, MMSE 17/30. TSH 1.87 06/17/20.              Hyperlipidemia, takes Rosuvastatin, LDL 86 06/17/20             Depression/anxiety, takes Escitalopram Seroquel, Depakote, Clonazepam-did not tolerate GDR prn-relapsed restlessness, angry spells and agitation. valproic acid 36 12/04/20             Vit B12 deficiency, takes Vit B 12 3000mg  qd, Vit B12 level 551 06/17/20.              Vit D deficiency, takes Vit D             OP, takes Alendronate(started 2019 after foot fx) needs DEXA 2023             Dysphagia, diet modification, speech therapy.   Past Medical History:  Diagnosis Date   Abnormal weight loss    Age-related osteoporosis  without current pathological fracture    Allergic rhinitis    Atherosclerosis of aorta (HCC)    Chronic obstructive pulmonary disease (COPD) (HCC)    Corns and callosities    Depression    H/O mammogram 2021   Per [place] New Patient Packet   Hyperlipemia    Hyperlipidemia    Melanocytic nevi, unspecified    Mild cognitive impairment, so stated    Osteopenia    Osteoporosis    Other specified disorders of bone density and structure, unspecified site    Pain in unspecified ankle and joints of unspecified foot    Past Surgical History:  Procedure Laterality Date   COLONOSCOPY  2020   Per [place] Patient Packet    Allergies  Allergen Reactions   Sertraline Other (See Comments)    Suicidal thoughts   Penicillin G Rash    Welts    Allergies as of 01/20/2021       Reactions   Sertraline Other (See Comments)   Suicidal thoughts   Penicillin G Rash   Welts        Medication List        Accurate as of January 20, 2021 11:59 PM. If you have any questions, ask your nurse or doctor.  acetaminophen 325 MG tablet Commonly known as: TYLENOL Take 650 mg by mouth 2 (two) times daily as needed.   alendronate 70 MG tablet Commonly known as: FOSAMAX Take 70 mg by mouth once a week. Take with a full glass of water on an empty stomach.   calcium carbonate 1500 (600 Ca) MG Tabs tablet Commonly known as: OSCAL Take 600 mg by mouth 2 (two) times daily with a meal.   clonazePAM 0.5 MG tablet Commonly known as: KLONOPIN Take 1 tablet (0.5 mg total) by mouth 2 (two) times daily.   clonazePAM 0.5 MG tablet Commonly known as: KLONOPIN Take 0.5 mg by mouth as needed for anxiety.   CYANOCOBALAMIN PO Take 3,000 mcg by mouth daily.   divalproex 125 MG DR tablet Commonly known as: DEPAKOTE Take 125 mg by mouth daily.   divalproex 125 MG DR tablet Commonly known as: DEPAKOTE Take 125 mg by mouth 2 (two) times daily.   donepezil 5 MG tablet Commonly known as:  ARICEPT   escitalopram 10 MG tablet Commonly known as: LEXAPRO Take 10 mg by mouth daily.   hydrocortisone cream 1 % Apply 1 application topically 2 (two) times daily. Apply cream BID to (L) thigh & (R) calf until healed   memantine 10 MG tablet Commonly known as: NAMENDA Take 10 mg by mouth 2 (two) times daily.   multivitamin capsule Take by mouth daily. 400 mcg; amt: 1Tablet   QUEtiapine 25 MG tablet Commonly known as: SEROQUEL Take 25 mg by mouth 2 (two) times daily.   rosuvastatin 10 MG tablet Commonly known as: CRESTOR Take 10 mg by mouth daily.   Vitamin D 50 MCG (2000 UT) tablet Take 2,000 Units by mouth daily.        Review of Systems  Constitutional:  Negative for activity change, appetite change and fever.       Gradual weight gained.   HENT:  Negative for congestion, hearing loss and trouble swallowing.   Eyes:  Negative for visual disturbance.  Respiratory:  Negative for shortness of breath.   Cardiovascular:  Negative for leg swelling.  Gastrointestinal:  Negative for abdominal pain and constipation.  Genitourinary:  Negative for dysuria, frequency and urgency.  Musculoskeletal:  Positive for arthralgias. Negative for gait problem.  Skin:  Positive for rash. Negative for color change.  Neurological:  Negative for speech difficulty, weakness and light-headedness.       Memory lapses.   Psychiatric/Behavioral:  Positive for agitation, behavioral problems, confusion and sleep disturbance. The patient is nervous/anxious.        Stabilizing.    Immunization History  Administered Date(s) Administered   Influenza Inj Mdck Quad With Preservative 10/11/2015   Influenza, High Dose Seasonal PF 11/21/2013   Influenza, Quadrivalent, Recombinant, Inj, Pf 10/01/2017, 10/12/2018, 10/27/2019   Influenza-Unspecified 11/22/2014, 11/12/2020   PFIZER(Purple Top)SARS-COV-2 Vaccination 02/16/2019, 03/09/2019, 10/26/2019, 06/24/2020, 10/14/2020   PPD Test 10/23/2005    Pneumococcal Conjugate-13 08/08/2013   Pneumococcal Polysaccharide-23 12/27/2011, 01/25/2018   Tdap 11/28/2009   Zoster, Live 03/15/2005   Zoster, Unspecified 09/01/2016, 12/02/2016   Pertinent  Health Maintenance Due  Topic Date Due   DEXA SCAN  Never done   INFLUENZA VACCINE  Completed   No flowsheet data found. Functional Status Survey:    Vitals:   01/20/21 1532  BP: 118/68  Pulse: 62  Resp: 16  Temp: (!) 97.5 F (36.4 C)  SpO2: 97%  Weight: 127 lb 9.6 oz (57.9 kg)  Height: 5\' 5"  (1.651 m)  Body mass index is 21.23 kg/m. Physical Exam Vitals and nursing note reviewed.  Constitutional:      Appearance: Normal appearance.  HENT:     Head: Normocephalic and atraumatic.     Mouth/Throat:     Mouth: Mucous membranes are moist.  Eyes:     Extraocular Movements: Extraocular movements intact.     Conjunctiva/sclera: Conjunctivae normal.     Pupils: Pupils are equal, round, and reactive to light.  Cardiovascular:     Rate and Rhythm: Normal rate and regular rhythm.     Heart sounds: No murmur heard. Pulmonary:     Effort: Pulmonary effort is normal.     Breath sounds: No rales.  Abdominal:     General: Bowel sounds are normal.     Palpations: Abdomen is soft.     Tenderness: There is no abdominal tenderness.  Musculoskeletal:     Cervical back: Normal range of motion and neck supple.     Right lower leg: No edema.     Left lower leg: No edema.  Skin:    General: Skin is warm and dry.     Findings: Rash present.     Comments: Scratched marks left thigh, right calf, no apparent rash  Neurological:     General: No focal deficit present.     Mental Status: She is alert. Mental status is at baseline.     Gait: Gait normal.     Comments: Oriented to person  Psychiatric:     Comments: Smile, followed simple directions during my examination today    Labs reviewed: Recent Labs    07/16/20 0000 08/20/20 0000 12/03/20 0000  NA 136* 138 140  K 4.5 4.1 4.4   CL 98* 99 101  CO2 30* 29* 24*  BUN 14 14 16   CREATININE 0.7 0.7 0.7  CALCIUM 9.6 9.4 9.9   Recent Labs    07/16/20 0000 08/20/20 0000 12/03/20 0000  AST 29 22 23   ALT 26 20 19   ALKPHOS 69 57 71  ALBUMIN 4.3 3.7 4.1   Recent Labs    07/16/20 0000 08/20/20 0000 12/03/20 0000  WBC 7.6 7.6 7.3  NEUTROABS 3,762.00 3,595.00 3,811.00  HGB 15.0 13.9 14.7  HCT 46 41 44  PLT 388 294 276   Lab Results  Component Value Date   TSH 2.04 12/03/2020   No results found for: HGBA1C Lab Results  Component Value Date   CHOL 178 06/18/2020   HDL 77 (A) 06/18/2020   LDLCALC 86 06/18/2020   TRIG 61 06/18/2020    Significant Diagnostic Results in last 30 days:  No results found.  Assessment/Plan: Dermatitis itching rash left thigh, R calf. Note scratched marks, no apparent rash, the patient denied itching or pain during my examination. Will apply 1% Hydrocortisone cream to affected areas bid until healed.   Alzheimer disease ([place]) takes Donepezil, Memantine,  no CT/MRI of brain located. F/u [facility] memory care center, MMSE 17/30. TSH 1.87 06/17/20.   Hyperlipidemia takes Rosuvastatin, LDL 86 06/17/20  Depression with anxiety Stabilizing,  takes Escitalopram Seroquel, Depakote, Clonazepam-did not tolerate GDR prn-relapsed restlessness, angry  Vitamin B12 deficiency , takes Vit B 12 3000mg  qd, Vit B12 level 551 06/17/20.   Vitamin D deficiency Taking Vit D  Osteoporosis takes Alendronate(started 2019 after foot fx) needs DEXA 2023  Dysphagia diet modification, speech therapy.     Family/ staff Communication: plan of care reviewed with the patient and charge nurse.   Labs/tests ordered:  none  Time spend 35 minutes.

## 2021-01-20 NOTE — Assessment & Plan Note (Signed)
takes Alendronate(started 2019 after foot fx) needs DEXA 2023

## 2021-01-20 NOTE — Assessment & Plan Note (Signed)
takes Donepezil, Memantine,  no CT/MRI of brain located. F/u [facility] memory care center, MMSE 17/30. TSH 1.87 06/17/20.

## 2021-01-20 NOTE — Assessment & Plan Note (Signed)
takes Rosuvastatin, LDL 86 06/17/20

## 2021-01-20 NOTE — Assessment & Plan Note (Signed)
,   takes Vit B 12 3000mg  qd, Vit B12 level 551 06/17/20.

## 2021-01-20 NOTE — Assessment & Plan Note (Signed)
itching rash left thigh, R calf. Note scratched marks, no apparent rash, the patient denied itching or pain during my examination. Will apply 1% Hydrocortisone cream to affected areas bid until healed.

## 2021-01-20 NOTE — Assessment & Plan Note (Signed)
diet modification, speech therapy.

## 2021-01-20 NOTE — Assessment & Plan Note (Signed)
Stabilizing,  takes Escitalopram Seroquel, Depakote, Clonazepam-did not tolerate GDR prn-relapsed restlessness, angry

## 2021-01-20 NOTE — Assessment & Plan Note (Signed)
Taking Vit D

## 2021-01-21 ENCOUNTER — Encounter: Payer: Self-pay | Admitting: Nurse Practitioner

## 2021-01-27 ENCOUNTER — Encounter: Payer: Self-pay | Admitting: Nurse Practitioner

## 2021-01-27 ENCOUNTER — Non-Acute Institutional Stay (SKILLED_NURSING_FACILITY): Payer: Medicare PPO | Admitting: Nurse Practitioner

## 2021-01-27 DIAGNOSIS — R635 Abnormal weight gain: Secondary | ICD-10-CM | POA: Diagnosis not present

## 2021-01-27 DIAGNOSIS — E538 Deficiency of other specified B group vitamins: Secondary | ICD-10-CM | POA: Diagnosis not present

## 2021-01-27 DIAGNOSIS — F418 Other specified anxiety disorders: Secondary | ICD-10-CM

## 2021-01-27 DIAGNOSIS — E785 Hyperlipidemia, unspecified: Secondary | ICD-10-CM | POA: Diagnosis not present

## 2021-01-27 DIAGNOSIS — G309 Alzheimer's disease, unspecified: Secondary | ICD-10-CM | POA: Diagnosis not present

## 2021-01-27 DIAGNOSIS — F0280 Dementia in other diseases classified elsewhere without behavioral disturbance: Secondary | ICD-10-CM

## 2021-01-27 DIAGNOSIS — M810 Age-related osteoporosis without current pathological fracture: Secondary | ICD-10-CM | POA: Diagnosis not present

## 2021-01-27 DIAGNOSIS — R1312 Dysphagia, oropharyngeal phase: Secondary | ICD-10-CM

## 2021-01-27 DIAGNOSIS — W19XXXA Unspecified fall, initial encounter: Secondary | ICD-10-CM

## 2021-01-27 DIAGNOSIS — M25561 Pain in right knee: Secondary | ICD-10-CM | POA: Diagnosis not present

## 2021-01-27 DIAGNOSIS — R296 Repeated falls: Secondary | ICD-10-CM | POA: Insufficient documentation

## 2021-01-27 NOTE — Assessment & Plan Note (Addendum)
c/o the right knee and shin pain sustained from fall on her knees 01/24/21. Noted warmth, effusion mostly lateral aspect, painful with certain way of her walking. X-ray 3 views R knee/tibia/fibula, Tylenol 650mg  tid with meals x 7 days, PT to eval/tx.  01/29/20 X-ray R Knee/tibia/fibula: no acute fx.

## 2021-01-27 NOTE — Assessment & Plan Note (Signed)
takes Rosuvastatin, LDL 86 06/17/20

## 2021-01-27 NOTE — Assessment & Plan Note (Signed)
diet modification, speech therapy.

## 2021-01-27 NOTE — Assessment & Plan Note (Signed)
takes Donepezil, Memantine,  no CT/MRI of brain located. F/u [facility] memory care center, MMSE 17/30. TSH 1.87 06/17/20.

## 2021-01-27 NOTE — Assessment & Plan Note (Signed)
Needs close supervision/assistance, contributory factors: increased frailty, lack of safety awareness. Increased risk of falling.

## 2021-01-27 NOTE — Assessment & Plan Note (Signed)
,   takes Vit B 12 3000mg  qd, Vit B12 level 551 06/17/20. Hgb 14.7 12/03/20

## 2021-01-27 NOTE — Assessment & Plan Note (Signed)
takes Alendronate(started 2019 after foot fx) needs DEXA 2023

## 2021-01-27 NOTE — Progress Notes (Addendum)
Location:   SNF [place] Room Number: 110 Place of Service:  SNF (31) Provider: [facility] Skye Plamondon NP  [name], MD  Patient Care Team: [name], MD as PCP - General (Internal Medicine)  Extended Emergency Contact Information Primary Emergency Contact: B and E States of [place] Phone: [phone] Relation: Spouse Secondary Emergency Contact: Demaria, Deeney Mobile Phone: [phone] Relation: Other  Code Status: DNR Goals of care: Advanced Directive information Advanced Directives 01/27/2021  Does Patient Have a Medical Advance Directive? Yes  Type of Paramedic of [place];Living will  Does patient want to make changes to medical advance directive? No - Patient declined  Copy of [place] in Chart? Yes - validated most recent copy scanned in chart (See row information)     Chief Complaint  Patient presents with   Acute Visit    knee pain sustained from fall     HPI:  Pt is a 76 y.o. female seen today for an acute visit for c/o the right knee and shin pain sustained from fall on her knees 01/24/21. Noted warmth, effusion mostly lateral aspect, painful with certain way of her walking.    Alzheimer's dementia, takes Donepezil, Memantine,  no CT/MRI of brain located. F/u [facility] memory care center, MMSE 17/30. TSH 1.87 06/17/20.              Hyperlipidemia, takes Rosuvastatin, LDL 86 06/17/20             Depression/anxiety, takes Escitalopram Seroquel, Depakote, Clonazepam-did not tolerate GDR, valproic acid 36 12/04/20             Vit B12 deficiency, takes Vit B 12 3000mg  qd, Vit B12 level 551 06/17/20. Hgb 14.7 12/03/20             Vit D deficiency, takes Vit D             OP, takes Alendronate(started 2019 after foot fx) needs DEXA 2023             Dysphagia, diet modification, speech therapy.   Past Medical History:  Diagnosis Date   Abnormal weight loss    Age-related osteoporosis without  current pathological fracture    Allergic rhinitis    Atherosclerosis of aorta (HCC)    Chronic obstructive pulmonary disease (COPD) (HCC)    Corns and callosities    Depression    H/O mammogram 2021   Per [place] New Patient Packet   Hyperlipemia    Hyperlipidemia    Melanocytic nevi, unspecified    Mild cognitive impairment, so stated    Osteopenia    Osteoporosis    Other specified disorders of bone density and structure, unspecified site    Pain in unspecified ankle and joints of unspecified foot    Past Surgical History:  Procedure Laterality Date   COLONOSCOPY  2020   Per [facility] New Patient Packet    Allergies  Allergen Reactions   Sertraline Other (See Comments)    Suicidal thoughts   Penicillin G Rash    Welts    Allergies as of 01/27/2021       Reactions   Sertraline Other (See Comments)   Suicidal thoughts   Penicillin G Rash   Welts        Medication List        Accurate as of January 27, 2021 11:59 PM. If you have any questions, ask your nurse or doctor.  acetaminophen 325 MG tablet Commonly known as: TYLENOL Take 650 mg by mouth 3 (three) times daily.   alendronate 70 MG tablet Commonly known as: FOSAMAX Take 70 mg by mouth once a week. Take with a full glass of water on an empty stomach.   calcium carbonate 1500 (600 Ca) MG Tabs tablet Commonly known as: OSCAL Take 600 mg by mouth 2 (two) times daily with a meal.   clonazePAM 0.5 MG tablet Commonly known as: KLONOPIN Take 1 tablet (0.5 mg total) by mouth 2 (two) times daily.   clonazePAM 0.5 MG tablet Commonly known as: KLONOPIN Take 0.5 mg by mouth as needed for anxiety.   CYANOCOBALAMIN PO Take 3,000 mcg by mouth daily.   divalproex 125 MG DR tablet Commonly known as: DEPAKOTE Take 125 mg by mouth daily.   divalproex 125 MG DR tablet Commonly known as: DEPAKOTE Take 250 mg by mouth 2 (two) times daily.   donepezil 5 MG tablet Commonly known as:  ARICEPT   escitalopram 10 MG tablet Commonly known as: LEXAPRO Take 10 mg by mouth daily.   hydrocortisone cream 1 % Apply 1 application topically 2 (two) times daily. Apply cream BID to (L) thigh & (R) calf until healed   memantine 10 MG tablet Commonly known as: NAMENDA Take 10 mg by mouth 2 (two) times daily.   multivitamin capsule Take by mouth daily. 400 mcg; amt: 1Tablet   QUEtiapine 25 MG tablet Commonly known as: SEROQUEL Take 25 mg by mouth 2 (two) times daily.   rosuvastatin 10 MG tablet Commonly known as: CRESTOR Take 10 mg by mouth daily.   Vitamin D 50 MCG (2000 UT) tablet Take 2,000 Units by mouth daily.        Review of Systems  Constitutional:  Positive for unexpected weight change. Negative for activity change, appetite change and fever.       Weight gained #7Ibs in one week?  HENT:  Negative for congestion, hearing loss and trouble swallowing.   Eyes:  Negative for visual disturbance.  Respiratory:  Negative for shortness of breath.   Cardiovascular:  Negative for leg swelling.  Gastrointestinal:  Negative for abdominal pain and constipation.  Genitourinary:  Negative for dysuria, frequency and urgency.  Musculoskeletal:  Positive for arthralgias and joint swelling. Negative for gait problem.        warmth, effusion mostly lateral aspect, painful with certain way of her walking.    Skin:  Negative for color change.  Neurological:  Negative for speech difficulty, weakness and light-headedness.       Memory lapses.   Psychiatric/Behavioral:  Positive for agitation, behavioral problems, confusion and sleep disturbance. The patient is nervous/anxious.        Stabilizing.    Immunization History  Administered Date(s) Administered   Influenza Inj Mdck Quad With Preservative 10/11/2015   Influenza, High Dose Seasonal PF 11/21/2013   Influenza, Quadrivalent, Recombinant, Inj, Pf 10/01/2017, 10/12/2018, 10/27/2019   Influenza-Unspecified 11/22/2014,  11/12/2020   PFIZER(Purple Top)SARS-COV-2 Vaccination 02/16/2019, 03/09/2019, 10/26/2019, 06/24/2020, 10/14/2020   PPD Test 10/23/2005   Pneumococcal Conjugate-13 08/08/2013   Pneumococcal Polysaccharide-23 12/27/2011, 01/25/2018   Tdap 11/28/2009   Zoster, Live 03/15/2005   Zoster, Unspecified 09/01/2016, 12/02/2016   Pertinent  Health Maintenance Due  Topic Date Due   DEXA SCAN  Never done   INFLUENZA VACCINE  Completed   No flowsheet data found. Functional Status Survey:    Vitals:   01/27/21 1624  BP: 127/76  Pulse: 63  Resp:  18  Temp: (!) 97 F (36.1 C)  SpO2: 95%  Weight: 134 lb 12.8 oz (61.1 kg)  Height: 5\' 5"  (1.651 m)   Body mass index is 22.43 kg/m. Physical Exam Vitals and nursing note reviewed.  Constitutional:      Appearance: Normal appearance.  HENT:     Head: Normocephalic and atraumatic.     Mouth/Throat:     Mouth: Mucous membranes are moist.  Eyes:     Extraocular Movements: Extraocular movements intact.     Conjunctiva/sclera: Conjunctivae normal.     Pupils: Pupils are equal, round, and reactive to light.  Cardiovascular:     Rate and Rhythm: Normal rate and regular rhythm.     Heart sounds: No murmur heard. Pulmonary:     Effort: Pulmonary effort is normal.     Breath sounds: No rales.  Abdominal:     General: Bowel sounds are normal.     Palpations: Abdomen is soft.     Tenderness: There is no abdominal tenderness.  Musculoskeletal:        General: Swelling, tenderness and signs of injury present.     Cervical back: Normal range of motion and neck supple.     Right lower leg: No edema.     Left lower leg: No edema.     Comments:  warmth, effusion mostly lateral aspect, painful with certain way of her walking.    Skin:    General: Skin is warm and dry.  Neurological:     General: No focal deficit present.     Mental Status: She is alert. Mental status is at baseline.     Gait: Gait normal.     Comments: Oriented to  person  Psychiatric:     Comments: Smile, followed simple directions during my examination today    Labs reviewed: Recent Labs    07/16/20 0000 08/20/20 0000 12/03/20 0000  NA 136* 138 140  K 4.5 4.1 4.4  CL 98* 99 101  CO2 30* 29* 24*  BUN 14 14 16   CREATININE 0.7 0.7 0.7  CALCIUM 9.6 9.4 9.9   Recent Labs    07/16/20 0000 08/20/20 0000 12/03/20 0000  AST 29 22 23   ALT 26 20 19   ALKPHOS 69 57 71  ALBUMIN 4.3 3.7 4.1   Recent Labs    07/16/20 0000 08/20/20 0000 12/03/20 0000  WBC 7.6 7.6 7.3  NEUTROABS 3,762.00 3,595.00 3,811.00  HGB 15.0 13.9 14.7  HCT 46 41 44  PLT 388 294 276   Lab Results  Component Value Date   TSH 2.04 12/03/2020   No results found for: HGBA1C Lab Results  Component Value Date   CHOL 178 06/18/2020   HDL 77 (A) 06/18/2020   LDLCALC 86 06/18/2020   TRIG 61 06/18/2020    Significant Diagnostic Results in last 30 days:  No results found.  Assessment/Plan: Acute pain of right knee c/o the right knee and shin pain sustained from fall on her knees 01/24/21. Noted warmth, effusion mostly lateral aspect, painful with certain way of her walking. X-ray 3 views R knee/tibia/fibula, Tylenol 650mg  tid with meals x 7 days, PT to eval/tx.  01/29/20 X-ray R Knee/tibia/fibula: no acute fx.   Alzheimer disease ([place]) takes Donepezil, Memantine,  no CT/MRI of brain located. F/u [facility] memory care center, MMSE 17/30. TSH 1.87 06/17/20.   Hyperlipidemia  takes Rosuvastatin, LDL 86 06/17/20  Depression with anxiety  takes Escitalopram Seroquel, Depakote, Clonazepam-did not tolerate GDR, valproic acid 36  12/04/20  Vitamin B12 deficiency , takes Vit B 12 3000mg  qd, Vit B12 level 551 06/17/20. Hgb 14.7 12/03/20  Osteoporosis  takes Alendronate(started 2019 after foot fx) needs DEXA 2023  Dysphagia diet modification, speech therapy.   Fall Needs close supervision/assistance, contributory factors: increased frailty, lack of safety awareness.  Increased risk of falling.   Weight gain Weight gained #7Ibs in one week? Weekly weight, reported if weight gained >#3-5Ibs per wk.      Family/ staff Communication: plan of care reviewed with the patient and charge nurse.   Labs/tests ordered:  X-ray 3 views right knee/tibia/fibula.   Time spend 35 minutes.

## 2021-01-27 NOTE — Assessment & Plan Note (Signed)
takes Escitalopram Seroquel, Depakote, Clonazepam-did not tolerate GDR, valproic acid 36 12/04/20

## 2021-01-28 DIAGNOSIS — M25561 Pain in right knee: Secondary | ICD-10-CM | POA: Diagnosis not present

## 2021-01-28 DIAGNOSIS — M79661 Pain in right lower leg: Secondary | ICD-10-CM | POA: Diagnosis not present

## 2021-01-29 ENCOUNTER — Encounter: Payer: Self-pay | Admitting: Nurse Practitioner

## 2021-01-29 DIAGNOSIS — M810 Age-related osteoporosis without current pathological fracture: Secondary | ICD-10-CM | POA: Diagnosis not present

## 2021-01-29 DIAGNOSIS — M228X1 Other disorders of patella, right knee: Secondary | ICD-10-CM | POA: Diagnosis not present

## 2021-01-29 DIAGNOSIS — L988 Other specified disorders of the skin and subcutaneous tissue: Secondary | ICD-10-CM | POA: Diagnosis not present

## 2021-01-29 NOTE — Assessment & Plan Note (Signed)
Weight gained #7Ibs in one week? Weekly weight, reported if weight gained >#3-5Ibs per wk.

## 2021-01-30 DIAGNOSIS — F02C11 Dementia in other diseases classified elsewhere, severe, with agitation: Secondary | ICD-10-CM | POA: Diagnosis not present

## 2021-01-30 DIAGNOSIS — F411 Generalized anxiety disorder: Secondary | ICD-10-CM | POA: Diagnosis not present

## 2021-02-03 DIAGNOSIS — L988 Other specified disorders of the skin and subcutaneous tissue: Secondary | ICD-10-CM | POA: Diagnosis not present

## 2021-02-03 DIAGNOSIS — M228X1 Other disorders of patella, right knee: Secondary | ICD-10-CM | POA: Diagnosis not present

## 2021-02-03 DIAGNOSIS — M810 Age-related osteoporosis without current pathological fracture: Secondary | ICD-10-CM | POA: Diagnosis not present

## 2021-02-04 DIAGNOSIS — M228X1 Other disorders of patella, right knee: Secondary | ICD-10-CM | POA: Diagnosis not present

## 2021-02-04 DIAGNOSIS — L988 Other specified disorders of the skin and subcutaneous tissue: Secondary | ICD-10-CM | POA: Diagnosis not present

## 2021-02-04 DIAGNOSIS — M810 Age-related osteoporosis without current pathological fracture: Secondary | ICD-10-CM | POA: Diagnosis not present

## 2021-02-06 ENCOUNTER — Ambulatory Visit
Admission: RE | Admit: 2021-02-06 | Discharge: 2021-02-06 | Disposition: A | Discharge status: Home / Self Care | Payer: Medicare PPO | Source: Ambulatory Visit | Attending: Internal Medicine | Admitting: Internal Medicine

## 2021-02-06 ENCOUNTER — Other Ambulatory Visit: Payer: Self-pay

## 2021-02-06 DIAGNOSIS — Z1231 Encounter for screening mammogram for malignant neoplasm of breast: Secondary | ICD-10-CM | POA: Diagnosis not present

## 2021-02-06 DIAGNOSIS — M810 Age-related osteoporosis without current pathological fracture: Secondary | ICD-10-CM | POA: Diagnosis not present

## 2021-02-06 DIAGNOSIS — M228X1 Other disorders of patella, right knee: Secondary | ICD-10-CM | POA: Diagnosis not present

## 2021-02-06 DIAGNOSIS — L988 Other specified disorders of the skin and subcutaneous tissue: Secondary | ICD-10-CM | POA: Diagnosis not present

## 2021-02-10 DIAGNOSIS — M810 Age-related osteoporosis without current pathological fracture: Secondary | ICD-10-CM | POA: Diagnosis not present

## 2021-02-10 DIAGNOSIS — M228X1 Other disorders of patella, right knee: Secondary | ICD-10-CM | POA: Diagnosis not present

## 2021-02-10 DIAGNOSIS — L988 Other specified disorders of the skin and subcutaneous tissue: Secondary | ICD-10-CM | POA: Diagnosis not present

## 2021-02-11 DIAGNOSIS — L988 Other specified disorders of the skin and subcutaneous tissue: Secondary | ICD-10-CM | POA: Diagnosis not present

## 2021-02-11 DIAGNOSIS — M810 Age-related osteoporosis without current pathological fracture: Secondary | ICD-10-CM | POA: Diagnosis not present

## 2021-02-11 DIAGNOSIS — M228X1 Other disorders of patella, right knee: Secondary | ICD-10-CM | POA: Diagnosis not present

## 2021-02-12 DIAGNOSIS — M228X1 Other disorders of patella, right knee: Secondary | ICD-10-CM | POA: Diagnosis not present

## 2021-02-12 DIAGNOSIS — R634 Abnormal weight loss: Secondary | ICD-10-CM | POA: Diagnosis not present

## 2021-02-12 DIAGNOSIS — L988 Other specified disorders of the skin and subcutaneous tissue: Secondary | ICD-10-CM | POA: Diagnosis not present

## 2021-02-12 DIAGNOSIS — I10 Essential (primary) hypertension: Secondary | ICD-10-CM | POA: Diagnosis not present

## 2021-02-12 DIAGNOSIS — M810 Age-related osteoporosis without current pathological fracture: Secondary | ICD-10-CM | POA: Diagnosis not present

## 2021-02-12 LAB — BASIC METABOLIC PANEL
BUN: 13 (ref 4–21)
CO2: 27 — AB (ref 13–22)
Chloride: 99 (ref 99–108)
Creatinine: 0.7 (ref 0.5–1.1)
Glucose: 86
Potassium: 4.2 (ref 3.4–5.3)
Sodium: 135 — AB (ref 137–147)

## 2021-02-12 LAB — HEPATIC FUNCTION PANEL
ALT: 25 (ref 7–35)
AST: 30 (ref 13–35)
Alkaline Phosphatase: 66 (ref 25–125)
Bilirubin, Total: 0.4

## 2021-02-12 LAB — COMPREHENSIVE METABOLIC PANEL
Albumin: 3.6 (ref 3.5–5.0)
Calcium: 9.2 (ref 8.7–10.7)
Globulin: 2.2

## 2021-02-12 LAB — CBC AND DIFFERENTIAL
HCT: 40 (ref 36–46)
Hemoglobin: 13.6 (ref 12.0–16.0)
Platelets: 274 (ref 150–399)
WBC: 6.4

## 2021-02-12 LAB — CBC: RBC: 4.13 (ref 3.87–5.11)

## 2021-02-16 DIAGNOSIS — M810 Age-related osteoporosis without current pathological fracture: Secondary | ICD-10-CM | POA: Diagnosis not present

## 2021-02-16 DIAGNOSIS — M228X1 Other disorders of patella, right knee: Secondary | ICD-10-CM | POA: Diagnosis not present

## 2021-02-16 DIAGNOSIS — L988 Other specified disorders of the skin and subcutaneous tissue: Secondary | ICD-10-CM | POA: Diagnosis not present

## 2021-02-17 ENCOUNTER — Encounter: Payer: Self-pay | Admitting: Internal Medicine

## 2021-02-17 ENCOUNTER — Non-Acute Institutional Stay (SKILLED_NURSING_FACILITY): Payer: Medicare PPO | Admitting: Internal Medicine

## 2021-02-17 DIAGNOSIS — E785 Hyperlipidemia, unspecified: Secondary | ICD-10-CM | POA: Diagnosis not present

## 2021-02-17 DIAGNOSIS — G309 Alzheimer's disease, unspecified: Secondary | ICD-10-CM | POA: Diagnosis not present

## 2021-02-17 DIAGNOSIS — F418 Other specified anxiety disorders: Secondary | ICD-10-CM

## 2021-02-17 DIAGNOSIS — R1312 Dysphagia, oropharyngeal phase: Secondary | ICD-10-CM | POA: Diagnosis not present

## 2021-02-17 DIAGNOSIS — E538 Deficiency of other specified B group vitamins: Secondary | ICD-10-CM

## 2021-02-17 DIAGNOSIS — M810 Age-related osteoporosis without current pathological fracture: Secondary | ICD-10-CM

## 2021-02-17 DIAGNOSIS — F0280 Dementia in other diseases classified elsewhere without behavioral disturbance: Secondary | ICD-10-CM | POA: Diagnosis not present

## 2021-02-17 NOTE — Progress Notes (Signed)
Location:   [place] Room Number: 110 Place of Service:  SNF [phone]) Provider:  Veleta Miners MD  [name], MD  Patient Care Team: [name], MD as PCP - General (Internal Medicine)  Extended Emergency Contact Information Primary Emergency Contact: Manas,[name] Address: [address]          [place], [place] [address] [name] of [place] Phone: [phone] Relation: Spouse Secondary Emergency Contact: Comp,[name] Mobile Phone: [phone] Relation: Other  Code Status:  DNR Managed Care Goals of care: Advanced Directive information Advanced Directives 02/17/2021  Does Patient Have a Medical Advance Directive? Yes  Type of Paramedic of [place];Living will  Does patient want to make changes to medical advance directive? No - Patient declined  Copy of [place] in Chart? Yes - validated most recent copy scanned in chart (See row information)     Chief Complaint  Patient presents with   Medical Management of Chronic Issues   Quality Metric Gaps    Hepatitis C Screening (Once)   Zoster Vaccines- Shingrix (1 of 2) DEXA SCAN (Once) TETANUS/TDAP       HPI:  Pt is a 76 y.o. female seen today for medical management of chronic diseases.     Patient has a history of Alzheimer's disease,, osteoporosis, vitamin D and B12 deficiency, hyperlipidemia, anxiety Before the admission patient Eloped her apartment and was found by the facility staff.  Her husband decided to move her to memory unit  She is stable. No new Nursing issues. No Behavior issues Stay more calm. Less paranoia Her weight is stable Walks with No assist  Did have a fall few weeks ago with No injury Wt Readings from Last 3 Encounters:  02/17/21 134 lb 12.8 oz (61.1 kg)  01/27/21 134 lb 12.8 oz (61.1 kg)  01/20/21 127 lb 9.6 oz (57.9 kg)  Right Knee pain Xray was negative for any Fracture Did not have any complain  of Pain today and was walking well Past Medical History:  Diagnosis Date   Abnormal weight loss    Age-related osteoporosis without current pathological fracture    Allergic rhinitis    Atherosclerosis of aorta (HCC)    Chronic obstructive pulmonary disease (COPD) (HCC)    Corns and callosities    Depression    H/O mammogram 2021   Per [facility] New Patient Packet   Hyperlipemia    Hyperlipidemia    Melanocytic nevi, unspecified    Mild cognitive impairment, so stated    Osteopenia    Osteoporosis    Other specified disorders of bone density and structure, unspecified site    Pain in unspecified ankle and joints of unspecified foot    Past Surgical History:  Procedure Laterality Date   COLONOSCOPY  2020   Per [facility] New Patient Packet    Allergies  Allergen Reactions   Sertraline Other (See Comments)    Suicidal thoughts   Penicillin G Rash    Welts    Allergies as of 02/17/2021       Reactions   Sertraline Other (See Comments)   Suicidal thoughts   Penicillin G Rash   Welts        Medication List        Accurate as of February 17, 2021 10:16 AM. If you have any questions, ask your nurse or doctor.          acetaminophen 325 MG tablet Commonly known as: TYLENOL  Take 650 mg by mouth 2 (two) times daily as needed.   alendronate 70 MG tablet Commonly known as: FOSAMAX Take 70 mg by mouth once a week. Take with a full glass of water on an empty stomach.   calcium carbonate 1500 (600 Ca) MG Tabs tablet Commonly known as: OSCAL Take 600 mg by mouth 2 (two) times daily with a meal.   clonazePAM 0.5 MG tablet Commonly known as: KLONOPIN Take 1 tablet (0.5 mg total) by mouth 2 (two) times daily.   clonazePAM 0.5 MG tablet Commonly known as: KLONOPIN Take 0.5 mg by mouth as needed for anxiety.   CYANOCOBALAMIN PO Take 3,000 mcg by mouth daily.   divalproex 125 MG DR tablet Commonly known as: DEPAKOTE Take 125 mg by mouth daily.   divalproex 125 MG DR  tablet Commonly known as: DEPAKOTE Take 250 mg by mouth 2 (two) times daily.   donepezil 5 MG tablet Commonly known as: ARICEPT   escitalopram 10 MG tablet Commonly known as: LEXAPRO Take 10 mg by mouth daily.   hydrocortisone cream 1 % Apply 1 application topically 2 (two) times daily. Apply cream BID to (L) thigh & (R) calf until healed   memantine 10 MG tablet Commonly known as: NAMENDA Take 10 mg by mouth 2 (two) times daily.   multivitamin capsule Take by mouth daily. 400 mcg; amt: 1Tablet   QUEtiapine 25 MG tablet Commonly known as: SEROQUEL Take 25 mg by mouth 2 (two) times daily.   rosuvastatin 10 MG tablet Commonly known as: CRESTOR Take 10 mg by mouth daily.   Vitamin D 50 MCG (2000 UT) tablet Take 2,000 Units by mouth daily.        Review of Systems  Unable to perform ROS: Dementia   Immunization History  Administered Date(s) Administered   Influenza Inj Mdck Quad With Preservative 10/11/2015   Influenza, High Dose Seasonal PF 11/21/2013   Influenza, Quadrivalent, Recombinant, Inj, Pf 10/01/2017, 10/12/2018, 10/27/2019   Influenza-Unspecified 11/22/2014, 11/12/2020   PFIZER(Purple Top)SARS-COV-2 Vaccination 02/16/2019, 03/09/2019, 10/26/2019, 06/24/2020, 10/14/2020   PPD Test 10/23/2005   Pneumococcal Conjugate-13 08/08/2013   Pneumococcal Polysaccharide-23 12/27/2011, 01/25/2018   Tdap 11/28/2009   Zoster, Live 03/15/2005   Zoster, Unspecified 09/01/2016, 12/02/2016   Pertinent  Health Maintenance Due  Topic Date Due   DEXA SCAN  Never done   INFLUENZA VACCINE  Completed   No flowsheet data found. Functional Status Survey:    Vitals:   02/17/21 1005  BP: 110/70  Pulse: 68  Resp: 18  Temp: 98.2 F (36.8 C)  SpO2: 92%  Weight: 134 lb 12.8 oz (61.1 kg)  Height: 5\' 5"  (1.651 m)   Body mass index is 22.43 kg/m. Physical Exam Vitals reviewed.  Constitutional:      Appearance: Normal appearance.  HENT:     Head: Normocephalic.      Nose: Nose normal.     Mouth/Throat:     Mouth: Mucous membranes are moist.     Pharynx: Oropharynx is clear.  Eyes:     Pupils: Pupils are equal, round, and reactive to light.  Cardiovascular:     Rate and Rhythm: Normal rate and regular rhythm.     Pulses: Normal pulses.     Heart sounds: Normal heart sounds. No murmur heard. Pulmonary:     Effort: Pulmonary effort is normal.     Breath sounds: Normal breath sounds.  Abdominal:     General: Abdomen is flat. Bowel sounds are normal.  Palpations: Abdomen is soft.  Musculoskeletal:        General: No swelling.     Cervical back: Neck supple.  Skin:    General: Skin is warm.  Neurological:     General: No focal deficit present.     Mental Status: She is alert.  Psychiatric:        Mood and Affect: Mood normal.        Thought Content: Thought content normal.    Labs reviewed: Recent Labs    08/20/20 0000 12/03/20 0000 02/12/21 0000  NA 138 140 135*  K 4.1 4.4 4.2  CL 99 101 99  CO2 29* 24* 27*  BUN 14 16 13   CREATININE 0.7 0.7 0.7  CALCIUM 9.4 9.9 9.2   Recent Labs    08/20/20 0000 12/03/20 0000 02/12/21 0000  AST 22 23 30   ALT 20 19 25   ALKPHOS 57 71 66  ALBUMIN 3.7 4.1 3.6   Recent Labs    07/16/20 0000 08/20/20 0000 12/03/20 0000 02/12/21 0000  WBC 7.6 7.6 7.3 6.4  NEUTROABS 3,762.00 3,595.00 3,811.00  --   HGB 15.0 13.9 14.7 13.6  HCT 46 41 44 40  PLT 388 294 276 274   Lab Results  Component Value Date   TSH 2.04 12/03/2020   No results found for: HGBA1C Lab Results  Component Value Date   CHOL 178 06/18/2020   HDL 77 (A) 06/18/2020   LDLCALC 86 06/18/2020   TRIG 61 06/18/2020    Significant Diagnostic Results in last 30 days:  MM 3D SCREEN BREAST BILATERAL  Result Date: 02/09/2021 CLINICAL DATA:  Screening. Best images obtainable due to patient condition/difficulty in positioning. EXAM: DIGITAL SCREENING BILATERAL MAMMOGRAM WITH TOMOSYNTHESIS AND CAD TECHNIQUE: Bilateral screening  digital craniocaudal and mediolateral oblique mammograms were obtained. Bilateral screening digital breast tomosynthesis was performed. The images were evaluated with computer-aided detection. COMPARISON:  Previous exam(s). ACR Breast Density Category b: There are scattered areas of fibroglandular density. FINDINGS: There are no findings suspicious for malignancy. IMPRESSION: No mammographic evidence of malignancy. A result letter of this screening mammogram will be mailed directly to the patient. RECOMMENDATION: Screening mammogram in one year. (Code:SM-B-01Y) BI-RADS CATEGORY  1: Negative. Electronically Signed   By: Ileana Roup M.D.   On: 02/09/2021 09:24    Assessment/Plan 1. Alzheimer disease ([place]) On Namenda and Aricept Failed GDR of Klonopin and Seroquel Will continue for now  2. Hyperlipidemia, unspecified hyperlipidemia type On statin LDL 86 in 5/22  3. Oropharyngeal dysphagia Works with Speech PRN  4. Osteoporosis, unspecified osteoporosis type, unspecified pathological fracture presence Fosamax since 2019  Continue Vit D Will Write for DEXA in facility  5. Vitamin B12 deficiency On Supplement  6. Depression with anxiety On Lexapro and Klonopin    Family/ staff Communication:   Labs/tests ordered:

## 2021-02-18 ENCOUNTER — Other Ambulatory Visit: Payer: Self-pay | Admitting: Orthopedic Surgery

## 2021-02-18 DIAGNOSIS — G309 Alzheimer's disease, unspecified: Secondary | ICD-10-CM

## 2021-02-18 DIAGNOSIS — L988 Other specified disorders of the skin and subcutaneous tissue: Secondary | ICD-10-CM | POA: Diagnosis not present

## 2021-02-18 DIAGNOSIS — M228X1 Other disorders of patella, right knee: Secondary | ICD-10-CM | POA: Diagnosis not present

## 2021-02-18 DIAGNOSIS — M810 Age-related osteoporosis without current pathological fracture: Secondary | ICD-10-CM | POA: Diagnosis not present

## 2021-02-18 DIAGNOSIS — F0280 Dementia in other diseases classified elsewhere without behavioral disturbance: Secondary | ICD-10-CM

## 2021-02-18 MED ORDER — CLONAZEPAM 0.5 MG PO TABS: 0.5000 mg | ORAL_TABLET | Freq: Two times a day (BID) | ORAL | 0 refills | Status: DC

## 2021-02-20 DIAGNOSIS — M810 Age-related osteoporosis without current pathological fracture: Secondary | ICD-10-CM | POA: Diagnosis not present

## 2021-02-20 DIAGNOSIS — L988 Other specified disorders of the skin and subcutaneous tissue: Secondary | ICD-10-CM | POA: Diagnosis not present

## 2021-02-20 DIAGNOSIS — M228X1 Other disorders of patella, right knee: Secondary | ICD-10-CM | POA: Diagnosis not present

## 2021-02-24 DIAGNOSIS — L988 Other specified disorders of the skin and subcutaneous tissue: Secondary | ICD-10-CM | POA: Diagnosis not present

## 2021-02-24 DIAGNOSIS — M228X1 Other disorders of patella, right knee: Secondary | ICD-10-CM | POA: Diagnosis not present

## 2021-02-24 DIAGNOSIS — M810 Age-related osteoporosis without current pathological fracture: Secondary | ICD-10-CM | POA: Diagnosis not present

## 2021-02-25 ENCOUNTER — Encounter: Payer: Self-pay | Admitting: Orthopedic Surgery

## 2021-02-25 ENCOUNTER — Other Ambulatory Visit: Payer: Self-pay | Admitting: Orthopedic Surgery

## 2021-02-25 ENCOUNTER — Non-Acute Institutional Stay (SKILLED_NURSING_FACILITY): Payer: Medicare PPO | Admitting: Orthopedic Surgery

## 2021-02-25 DIAGNOSIS — F02818 Dementia in other diseases classified elsewhere, unspecified severity, with other behavioral disturbance: Secondary | ICD-10-CM

## 2021-02-25 DIAGNOSIS — L988 Other specified disorders of the skin and subcutaneous tissue: Secondary | ICD-10-CM | POA: Diagnosis not present

## 2021-02-25 DIAGNOSIS — G309 Alzheimer's disease, unspecified: Secondary | ICD-10-CM

## 2021-02-25 DIAGNOSIS — M6281 Muscle weakness (generalized): Secondary | ICD-10-CM | POA: Diagnosis not present

## 2021-02-25 DIAGNOSIS — N3946 Mixed incontinence: Secondary | ICD-10-CM | POA: Diagnosis not present

## 2021-02-25 DIAGNOSIS — M228X1 Other disorders of patella, right knee: Secondary | ICD-10-CM | POA: Diagnosis not present

## 2021-02-25 DIAGNOSIS — M810 Age-related osteoporosis without current pathological fracture: Secondary | ICD-10-CM | POA: Diagnosis not present

## 2021-02-25 DIAGNOSIS — R29898 Other symptoms and signs involving the musculoskeletal system: Secondary | ICD-10-CM | POA: Diagnosis not present

## 2021-02-25 MED ORDER — CLONAZEPAM 0.5 MG PO TABS: 0.5000 mg | ORAL_TABLET | Freq: Every day | ORAL | 0 refills | Status: DC | PRN

## 2021-02-25 NOTE — Progress Notes (Signed)
Location:  [facility] Room Number: N110/A Place of Service:  SNF [phone]) Provider:  Yvonna Alanis, NP  Patient Care Team: [name], MD as PCP - General (Internal Medicine)  Extended Emergency Contact Information Primary Emergency Contact: Ryant,[name] Address: [address]          [place], [place] [address] [place] Phone: [phone] Relation: Spouse Secondary Emergency Contact: Wyszynski,[name] Mobile Phone: [phone] Relation: Other  Code Status: DNR Goals of care: Advanced Directive information Advanced Directives 02/25/2021  Does Patient Have a Medical Advance Directive? Yes  Type of Paramedic of [place];Living will  Does patient want to make changes to medical advance directive? No - Patient declined  Copy of [place] in Chart? Yes - validated most recent copy scanned in chart (See row information)     Chief Complaint  Patient presents with   Acute Visit    Increased agitation    HPI:  Pt is a 76 y.o. female seen today for an acute visit for increased agitation.   She currently resides on the memory care unit at [facility] due to Alzheimer's dementia.   Nursing reports increased agitation this morning. She keeps lingering at the door trying to leave. She also grabbed another residents walker and began walking beside resident. Initially she was not easily redirected, but was compliant with morning medications. She is very pleasant during our encounter. She is smiling and able to carry on a conversation. She does not seen anxious or agitated. She remains on Namenda, Aricept, Seroquel, Depakote and Klonopin.       Past Medical History:  Diagnosis Date   Abnormal weight loss    Age-related osteoporosis without current pathological fracture    Allergic rhinitis    Atherosclerosis of aorta (HCC)    Chronic obstructive pulmonary disease (COPD) (HCC)    Corns  and callosities    Depression    H/O mammogram 2021   Per [facility] New Patient Packet   Hyperlipemia    Hyperlipidemia    Melanocytic nevi, unspecified    Mild cognitive impairment, so stated    Osteopenia    Osteoporosis    Other specified disorders of bone density and structure, unspecified site    Pain in unspecified ankle and joints of unspecified foot    Past Surgical History:  Procedure Laterality Date   COLONOSCOPY  2020   Per [facility] New Patient Packet    Allergies  Allergen Reactions   Sertraline Other (See Comments)    Suicidal thoughts   Penicillin G Rash    Welts    Outpatient Encounter Medications as of 02/25/2021  Medication Sig   acetaminophen (TYLENOL) 325 MG tablet Take 650 mg by mouth 2 (two) times daily as needed.   alendronate (FOSAMAX) 70 MG tablet Take 70 mg by mouth once a week. Take with a full glass of water on an empty stomach.   calcium carbonate (OSCAL) 1500 (600 Ca) MG TABS tablet Take 600 mg by mouth 2 (two) times daily with a meal.   Cholecalciferol (VITAMIN D) 50 MCG (2000 UT) tablet Take 2,000 Units by mouth daily.   clonazePAM (KLONOPIN) 0.5 MG tablet Take 1 tablet (0.5 mg total) by mouth 2 (two) times daily.   clonazePAM (KLONOPIN) 0.5 MG tablet Take 1 tablet (0.5 mg total) by mouth daily as needed for anxiety.   CYANOCOBALAMIN PO Take 3,000 mcg by mouth daily.   divalproex (DEPAKOTE)  125 MG DR tablet Take 125 mg by mouth daily.   divalproex (DEPAKOTE) 125 MG DR tablet Take 250 mg by mouth 2 (two) times daily.   donepezil (ARICEPT) 5 MG tablet    escitalopram (LEXAPRO) 10 MG tablet Take 10 mg by mouth daily.   hydrocortisone cream 1 % Apply 1 application topically 2 (two) times daily. Apply cream BID to (L) thigh & (R) calf until healed   memantine (NAMENDA) 10 MG tablet Take 10 mg by mouth 2 (two) times daily.   Multiple Vitamin (MULTIVITAMIN) capsule Take by mouth daily. 400 mcg; amt: 1Tablet   QUEtiapine (SEROQUEL) 25 MG tablet Take 25 mg by mouth  2 (two) times daily.   rosuvastatin (CRESTOR) 10 MG tablet Take 10 mg by mouth daily.   No facility-administered encounter medications on file as of 02/25/2021.    Review of Systems  Unable to perform ROS: Dementia   Immunization History  Administered Date(s) Administered   Influenza Inj Mdck Quad With Preservative 10/11/2015   Influenza, High Dose Seasonal PF 11/21/2013   Influenza, Quadrivalent, Recombinant, Inj, Pf 10/01/2017, 10/12/2018, 10/27/2019   Influenza-Unspecified 11/22/2014, 11/12/2020   PFIZER(Purple Top)SARS-COV-2 Vaccination 02/16/2019, 03/09/2019, 10/26/2019, 06/24/2020, 10/14/2020   PPD Test 10/23/2005   Pneumococcal Conjugate-13 08/08/2013   Pneumococcal Polysaccharide-23 12/27/2011, 01/25/2018   Tdap 11/28/2009   Zoster, Live 03/15/2005   Zoster, Unspecified 09/01/2016, 12/02/2016   Pertinent  Health Maintenance Due  Topic Date Due   DEXA SCAN  Never done   INFLUENZA VACCINE  Completed   No flowsheet data found. Functional Status Survey:    Vitals:   02/25/21 1424  BP: 130/70  Pulse: 76  Resp: 18  Temp: (!) 97.1 F (36.2 C)  SpO2: 95%  Weight: 137 lb 3.2 oz (62.2 kg)  Height: 5\' 5"  (1.651 m)   Body mass index is 22.83 kg/m. Physical Exam Vitals reviewed.  Constitutional:      General: She is not in acute distress. HENT:     Head: Normocephalic.  Eyes:     General:        Right eye: No discharge.        Left eye: No discharge.  Cardiovascular:     Rate and Rhythm: Normal rate and regular rhythm.     Pulses: Normal pulses.     Heart sounds: Normal heart sounds. No murmur heard. Pulmonary:     Effort: Pulmonary effort is normal. No respiratory distress.     Breath sounds: Normal breath sounds. No wheezing.  Abdominal:     General: Bowel sounds are normal. There is no distension.     Palpations: Abdomen is soft.     Tenderness: There is no abdominal tenderness.  Musculoskeletal:     Cervical back: Neck supple.     Right lower leg: No  edema.     Left lower leg: No edema.  Skin:    General: Skin is warm and dry.     Capillary Refill: Capillary refill takes less than 2 seconds.  Neurological:     General: No focal deficit present.     Mental Status: She is alert. Mental status is at baseline.     Motor: No weakness.     Gait: Gait normal.  Psychiatric:        Mood and Affect: Mood normal.        Behavior: Behavior normal.    Labs reviewed: Recent Labs    08/20/20 0000 12/03/20 0000 02/12/21 0000  NA 138 140 135*  K 4.1 4.4 4.2  CL 99 101 99  CO2 29* 24* 27*  BUN 14 16 13   CREATININE 0.7 0.7 0.7  CALCIUM 9.4 9.9 9.2   Recent Labs    08/20/20 0000 12/03/20 0000 02/12/21 0000  AST 22 23 30   ALT 20 19 25   ALKPHOS 57 71 66  ALBUMIN 3.7 4.1 3.6   Recent Labs    07/16/20 0000 08/20/20 0000 12/03/20 0000 02/12/21 0000  WBC 7.6 7.6 7.3 6.4  NEUTROABS 3,762.00 3,595.00 3,811.00  --   HGB 15.0 13.9 14.7 13.6  HCT 46 41 44 40  PLT 388 294 276 274   Lab Results  Component Value Date   TSH 2.04 12/03/2020   No results found for: HGBA1C Lab Results  Component Value Date   CHOL 178 06/18/2020   HDL 77 (A) 06/18/2020   LDLCALC 86 06/18/2020   TRIG 61 06/18/2020    Significant Diagnostic Results in last 30 days:  MM 3D SCREEN BREAST BILATERAL  Result Date: 02/09/2021 CLINICAL DATA:  Screening. Best images obtainable due to patient condition/difficulty in positioning. EXAM: DIGITAL SCREENING BILATERAL MAMMOGRAM WITH TOMOSYNTHESIS AND CAD TECHNIQUE: Bilateral screening digital craniocaudal and mediolateral oblique mammograms were obtained. Bilateral screening digital breast tomosynthesis was performed. The images were evaluated with computer-aided detection. COMPARISON:  Previous exam(s). ACR Breast Density Category b: There are scattered areas of fibroglandular density. FINDINGS: There are no findings suspicious for malignancy. IMPRESSION: No mammographic evidence of malignancy. A result letter of  this screening mammogram will be mailed directly to the patient. RECOMMENDATION: Screening mammogram in one year. (Code:SM-B-01Y) BI-RADS CATEGORY  1: Negative. Electronically Signed   By: Ileana Roup M.D.   On: 02/09/2021 09:24    Assessment/Plan 1. Alzheimer's disease with behavioral disturbance ([place]) - behaviors of trying to leave and using residents walker earlier today - very pleasant with me, ambulates well on own - cont Namenda, Aricept, Seroquel, Depakote and Klonopin - start klonopin 0.5 mg daily prn x 14 days- report usage to provider in 14 days - cont memory care    Family/ staff Communication: plan discussed with nurse  Labs/tests ordered:  none

## 2021-02-26 DIAGNOSIS — L988 Other specified disorders of the skin and subcutaneous tissue: Secondary | ICD-10-CM | POA: Diagnosis not present

## 2021-02-26 DIAGNOSIS — M6281 Muscle weakness (generalized): Secondary | ICD-10-CM | POA: Diagnosis not present

## 2021-02-26 DIAGNOSIS — M810 Age-related osteoporosis without current pathological fracture: Secondary | ICD-10-CM | POA: Diagnosis not present

## 2021-02-26 DIAGNOSIS — N3946 Mixed incontinence: Secondary | ICD-10-CM | POA: Diagnosis not present

## 2021-02-26 DIAGNOSIS — M228X1 Other disorders of patella, right knee: Secondary | ICD-10-CM | POA: Diagnosis not present

## 2021-02-26 DIAGNOSIS — R29898 Other symptoms and signs involving the musculoskeletal system: Secondary | ICD-10-CM | POA: Diagnosis not present

## 2021-03-02 ENCOUNTER — Non-Acute Institutional Stay (SKILLED_NURSING_FACILITY): Payer: Medicare PPO | Admitting: Nurse Practitioner

## 2021-03-02 ENCOUNTER — Encounter: Payer: Self-pay | Admitting: Nurse Practitioner

## 2021-03-02 DIAGNOSIS — Z0000 Encounter for general adult medical examination without abnormal findings: Secondary | ICD-10-CM | POA: Diagnosis not present

## 2021-03-02 DIAGNOSIS — M6281 Muscle weakness (generalized): Secondary | ICD-10-CM | POA: Diagnosis not present

## 2021-03-02 DIAGNOSIS — N3946 Mixed incontinence: Secondary | ICD-10-CM | POA: Diagnosis not present

## 2021-03-02 DIAGNOSIS — R29898 Other symptoms and signs involving the musculoskeletal system: Secondary | ICD-10-CM | POA: Diagnosis not present

## 2021-03-02 DIAGNOSIS — M810 Age-related osteoporosis without current pathological fracture: Secondary | ICD-10-CM | POA: Diagnosis not present

## 2021-03-02 DIAGNOSIS — M228X1 Other disorders of patella, right knee: Secondary | ICD-10-CM | POA: Diagnosis not present

## 2021-03-02 DIAGNOSIS — L988 Other specified disorders of the skin and subcutaneous tissue: Secondary | ICD-10-CM | POA: Diagnosis not present

## 2021-03-02 NOTE — Progress Notes (Signed)
Location:  [facility] Room Number: 110-A Place of Service:  SNF (31) Provider: Taunja Brickner, NP  Patient Care Team: [name], MD as PCP - General (Internal Medicine)  Extended Emergency Contact Information Primary Emergency Contact: [name],[name] Address: [address]          [place], [place] [address] [name] Phone: [phone] Relation: Spouse Secondary Emergency Contact: Hanna,[name] Mobile Phone: [phone] Relation: Other  Code Status: DNR Goals of Care: Advanced Directive information Advanced Directives 03/02/2021  Does Patient Have a Medical Advance Directive? Yes  Type of Paramedic of [place];Living will;Out of facility DNR (pink MOST or yellow form)  Does patient want to make changes to medical advance directive? No - Patient declined  Copy of [place] in Chart? Yes - validated most recent copy scanned in chart (See row information)     Chief Complaint  Patient presents with   Medicare Wellness    Annual Wellness Visit.   Health Maintenance    Discuss the need for Dexa Scan, and Hepatitis C Screening.   Immunizations    Discuss the need for Shingrix vaccine, and Tetanus vaccine.    HPI: Patient is a 76 y.o. female seen in today for an annual wellness exam.    No flowsheet data found.  No flowsheet data found. No flowsheet data found.   Health Maintenance  Topic Date Due   Hepatitis C Screening  Never done   Zoster Vaccines- Shingrix (1 of 2) 09/06/1993   DEXA SCAN  Never done   TETANUS/TDAP  11/29/2019   Pneumonia Vaccine 59+ Years old  Completed   INFLUENZA VACCINE  Completed   COVID-19 Vaccine  Completed   HPV VACCINES  Aged Out    Urinary incontinence? Functional Status Survey: Is the patient deaf or have difficulty hearing?: Yes Does the patient have difficulty seeing, even when wearing glasses/contacts?: No Does the patient have  difficulty concentrating, remembering, or making decisions?: Yes Does the patient have difficulty walking or climbing stairs?: Yes Does the patient have difficulty dressing or bathing?: Yes Does the patient have difficulty doing errands alone such as visiting a doctor's office or shopping?: Yes Exercise? Diet? No results found. Hearing:   Dentition: Pain:  Past Medical History:  Diagnosis Date   Abnormal weight loss    Age-related osteoporosis without current pathological fracture    Allergic rhinitis    Atherosclerosis of aorta (HCC)    Chronic obstructive pulmonary disease (COPD) (HCC)    Corns and callosities    Depression    H/O mammogram 2021   Per [facility] New Patient Packet   Hyperlipemia    Hyperlipidemia    Melanocytic nevi, unspecified    Mild cognitive impairment, so stated    Osteopenia    Osteoporosis    Other specified disorders of bone density and structure, unspecified site    Pain in unspecified ankle and joints of unspecified foot     Past Surgical History:  Procedure Laterality Date   COLONOSCOPY  2020   Per [facility] New Patient Packet    Family History  Problem Relation Age of Onset   Hyperlipidemia Mother    Thyroid disease Mother    Heart failure Father    Alzheimer's disease Sister    Liver cancer Sister    Heart failure Brother    Stroke Brother     Social History   Socioeconomic History   Marital status: Married  Spouse name: Not on file   Number of children: Not on file   Years of education: Not on file   Highest education level: Not on file  Occupational History   Not on file  Tobacco Use   Smoking status: Never   Smokeless tobacco: Never  Substance and Sexual Activity   Alcohol use: Never   Drug use: Never   Sexual activity: Not on file  Other Topics Concern   Not on file  Social History Narrative   Per [facility] Patient Packet Abstracted on 06/19/2020    Diet: Normal      Caffeine: Yes, Iced  tea and coffee      Married, if yes what year: Yes, 1975      Do you live in a house, apartment, assisted living, condo, trailer, ect: Assisted Living       Is it one or more stories: One      How many persons live in your home?2      Pets: No      Highest level or education completed: Masters      Current/Past profession:Teacher, Scientist, physiological       Exercise:  Yes                Type and how often: 3 times weekly          Living Will: Yes   DNR: Yes   POA/HPOA: Yes      Functional Status:   Do you have difficulty bathing or dressing yourself? No   Do you have difficulty preparing food or eating? Yes   Do you have difficulty managing your medications? Yes   Do you have difficulty managing your finances? Yes   Do you have difficulty affording your medications? No   Social Determinants of Radio broadcast assistant Strain: Not on file  Food Insecurity: Not on file  Transportation Needs: Not on file  Physical Activity: Not on file  Stress: Not on file  Social Connections: Not on file    reports that she has never smoked. She has never used smokeless tobacco. She reports that she does not drink alcohol and does not use drugs.   Allergies  Allergen Reactions   Sertraline Other (See Comments)    Suicidal thoughts   Penicillin G Rash    Welts    Outpatient Encounter Medications as of 03/02/2021  Medication Sig   acetaminophen (TYLENOL) 325 MG tablet Take 650 mg by mouth 2 (two) times daily as needed.   alendronate (FOSAMAX) 70 MG tablet Take 70 mg by mouth once a week. Take with a full glass of water on an empty stomach.   calcium carbonate (OSCAL) 1500 (600 Ca) MG TABS tablet Take 600 mg by mouth 2 (two) times daily with a meal.   Cholecalciferol (VITAMIN D) 50 MCG (2000 UT) tablet Take 2,000 Units by mouth daily.   clonazePAM (KLONOPIN) 0.5 MG tablet Take 1 tablet (0.5 mg total) by mouth 2 (two) times daily.   clonazePAM (KLONOPIN) 0.5 MG tablet Take 1  tablet (0.5 mg total) by mouth daily as needed for anxiety.   CYANOCOBALAMIN PO Take 3,000 mcg by mouth daily.   divalproex (DEPAKOTE) 125 MG DR tablet Take 125 mg by mouth daily.   divalproex (DEPAKOTE) 125 MG DR tablet Take 250 mg by mouth 2 (two) times daily.   donepezil (ARICEPT) 5 MG tablet    escitalopram (LEXAPRO) 10 MG tablet Take 10 mg by mouth daily.  memantine (NAMENDA) 10 MG tablet Take 10 mg by mouth 2 (two) times daily.   Multiple Vitamin (MULTIVITAMIN) capsule Take by mouth daily. 400 mcg; amt: 1Tablet   QUEtiapine (SEROQUEL) 25 MG tablet Take 25 mg by mouth 2 (two) times daily.   rosuvastatin (CRESTOR) 10 MG tablet Take 10 mg by mouth daily.   [DISCONTINUED] hydrocortisone cream 1 % Apply 1 application topically 2 (two) times daily. Apply cream BID to (L) thigh & (R) calf until healed   No facility-administered encounter medications on file as of 03/02/2021.     Review of Systems:  Review of Systems  Physical Exam: Vitals:   03/02/21 1630  BP: 128/70  Pulse: 66  Resp: 18  Temp: 98.2 F (36.8 C)  SpO2: 95%  Weight: 137 lb 3.2 oz (62.2 kg)  Height: 5\' 5"  (1.651 m)   Body mass index is 22.83 kg/m. Physical Exam  Labs reviewed: Basic Metabolic Panel: Recent Labs    06/18/20 0000 07/16/20 0000 08/20/20 0000 12/03/20 0000 02/12/21 0000  NA 136*   < > 138 140 135*  K 4.1   < > 4.1 4.4 4.2  CL 100   < > 99 101 99  CO2 29*   < > 29* 24* 27*  BUN 12   12   < > 14 16 13   CREATININE 0.8   0.8   < > 0.7 0.7 0.7  CALCIUM 9.3   < > 9.4 9.9 9.2  TSH 1.87  --   --  2.04  --    < > = values in this interval not displayed.   Liver Function Tests: Recent Labs    08/20/20 0000 12/03/20 0000 02/12/21 0000  AST 22 23 30   ALT 20 19 25   ALKPHOS 57 71 66  ALBUMIN 3.7 4.1 3.6   No results for input(s): LIPASE, AMYLASE in the last 8760 hours. No results for input(s): AMMONIA in the last 8760 hours. CBC: Recent Labs    07/16/20 0000 08/20/20 0000  12/03/20 0000 02/12/21 0000  WBC 7.6 7.6 7.3 6.4  NEUTROABS 3,762.00 3,595.00 3,811.00  --   HGB 15.0 13.9 14.7 13.6  HCT 46 41 44 40  PLT 388 294 276 274   Lipid Panel: Recent Labs    06/18/20 0000  CHOL 178  HDL 77*  LDLCALC 86  TRIG 61   No results found for: HGBA1C  Procedures: MM 3D SCREEN BREAST BILATERAL  Result Date: 02/09/2021 CLINICAL DATA:  Screening. Best images obtainable due to patient condition/difficulty in positioning. EXAM: DIGITAL SCREENING BILATERAL MAMMOGRAM WITH TOMOSYNTHESIS AND CAD TECHNIQUE: Bilateral screening digital craniocaudal and mediolateral oblique mammograms were obtained. Bilateral screening digital breast tomosynthesis was performed. The images were evaluated with computer-aided detection. COMPARISON:  Previous exam(s). ACR Breast Density Category b: There are scattered areas of fibroglandular density. FINDINGS: There are no findings suspicious for malignancy. IMPRESSION: No mammographic evidence of malignancy. A result letter of this screening mammogram will be mailed directly to the patient. RECOMMENDATION: Screening mammogram in one year. (Code:SM-B-01Y) BI-RADS CATEGORY  1: Negative. Electronically Signed   By: Ileana Roup M.D.   On: 02/09/2021 09:24    Assessment/Plan There are no diagnoses linked to this encounter.   Labs/tests ordered:  * No order type specified * Next appt:  @NEXTENCTHIS  DEPT@         Subjective:   Dameka Younker Pollak is a 76 y.o. female who presents for Medicare Annual (Subsequent) preventive examination at [facility]  [facility].  Cardiac Risk Factors include: advanced age (>25men, >47 women);dyslipidemia     Objective:    Today's Vitals   03/02/21 1630 03/03/21 1423  BP: 128/70   Pulse: 66   Resp: 18   Temp: 98.2 F (36.8 C)   SpO2: 95%   Weight: 137 lb 3.2 oz (62.2 kg)   Height: 5\' 5"  (1.651 m)   PainSc:  0-No pain   Body mass index is 22.83 kg/m.  Advanced Directives  03/02/2021 02/25/2021 02/17/2021 01/27/2021 01/20/2021 12/26/2020 12/01/2020  Does Patient Have a Medical Advance Directive? Yes Yes Yes Yes Yes Yes Yes  Type of Paramedic of [place];Living will;Out of facility DNR (pink MOST or yellow form) [place];Living will [place];Living will [place];Living will [place];Living will [place];Living will [place];Living will  Does patient want to make changes to medical advance directive? No - Patient declined No - Patient declined No - Patient declined No - Patient declined No - Patient declined No - Patient declined No - Patient declined  Copy of [place] in Chart? Yes - validated most recent copy scanned in chart (See row information) Yes - validated most recent copy scanned in chart (See row information) Yes - validated most recent copy scanned in chart (See row information) Yes - validated most recent copy scanned in chart (See row information) Yes - validated most recent copy scanned in chart (See row information) Yes - validated most recent copy scanned in chart (See row information) Yes - validated most recent copy scanned in chart (See row information)    Current Medications (verified) Outpatient Encounter Medications as of 03/02/2021  Medication Sig   acetaminophen (TYLENOL) 325 MG tablet Take 650 mg by mouth 2 (two) times daily as needed.   alendronate (FOSAMAX) 70 MG tablet Take 70 mg by mouth once a week. Take with a full glass of water on an empty stomach.   calcium carbonate (OSCAL) 1500 (600 Ca) MG TABS tablet Take 600 mg by mouth 2 (two) times daily with a meal.   Cholecalciferol (VITAMIN D) 50 MCG (2000 UT) tablet Take 2,000 Units by mouth daily.   clonazePAM (KLONOPIN) 0.5 MG tablet Take 1 tablet (0.5 mg total) by mouth 2 (two) times daily.   clonazePAM (KLONOPIN) 0.5 MG tablet Take 1  tablet (0.5 mg total) by mouth daily as needed for anxiety.   CYANOCOBALAMIN PO Take 3,000 mcg by mouth daily.   divalproex (DEPAKOTE) 125 MG DR tablet Take 125 mg by mouth daily.   divalproex (DEPAKOTE) 125 MG DR tablet Take 250 mg by mouth 2 (two) times daily.   donepezil (ARICEPT) 5 MG tablet    escitalopram (LEXAPRO) 10 MG tablet Take 10 mg by mouth daily.   memantine (NAMENDA) 10 MG tablet Take 10 mg by mouth 2 (two) times daily.   Multiple Vitamin (MULTIVITAMIN) capsule Take by mouth daily. 400 mcg; amt: 1Tablet   QUEtiapine (SEROQUEL) 25 MG tablet Take 25 mg by mouth 2 (two) times daily.   rosuvastatin (CRESTOR) 10 MG tablet Take 10 mg by mouth daily.   [DISCONTINUED] hydrocortisone cream 1 % Apply 1 application topically 2 (two) times daily. Apply cream BID to (L) thigh & (R) calf until healed   No facility-administered encounter medications on file as of 03/02/2021.    Allergies (verified) Sertraline and Penicillin g   History: Past Medical History:  Diagnosis  Date   Abnormal weight loss    Age-related osteoporosis without current pathological fracture    Allergic rhinitis    Atherosclerosis of aorta (HCC)    Chronic obstructive pulmonary disease (COPD) (HCC)    Corns and callosities    Depression    H/O mammogram 2021   Per [facility] New Patient Packet   Hyperlipemia    Hyperlipidemia    Melanocytic nevi, unspecified    Mild cognitive impairment, so stated    Osteopenia    Osteoporosis    Other specified disorders of bone density and structure, unspecified site    Pain in unspecified ankle and joints of unspecified foot    Past Surgical History:  Procedure Laterality Date   COLONOSCOPY  2020   Per [facility] New Patient Packet   Family History  Problem Relation Age of Onset   Hyperlipidemia Mother    Thyroid disease Mother    Heart failure Father    Alzheimer's disease Sister    Liver cancer Sister    Heart failure Brother    Stroke  Brother    Social History   Socioeconomic History   Marital status: Married    Spouse name: Not on file   Number of children: Not on file   Years of education: Not on file   Highest education level: Not on file  Occupational History   Not on file  Tobacco Use   Smoking status: Never   Smokeless tobacco: Never  Substance and Sexual Activity   Alcohol use: Never   Drug use: Never   Sexual activity: Not on file  Other Topics Concern   Not on file  Social History Narrative   Per [facility] New Patient Packet Abstracted on 06/19/2020    Diet: Normal      Caffeine: Yes, Iced tea and coffee      Married, if yes what year: Yes, 1975      Do you live in a house, apartment, assisted living, condo, trailer, ect: Assisted Living       Is it one or more stories: One      How many persons live in your home?2      Pets: No      Highest level or education completed: Masters      Current/Past profession:Teacher, Scientist, physiological       Exercise:  Yes                Type and how often: 3 times weekly          Living Will: Yes   DNR: Yes   POA/HPOA: Yes      Functional Status:   Do you have difficulty bathing or dressing yourself? No   Do you have difficulty preparing food or eating? Yes   Do you have difficulty managing your medications? Yes   Do you have difficulty managing your finances? Yes   Do you have difficulty affording your medications? No   Social Determinants of Radio broadcast assistant Strain: Not on file  Food Insecurity: Not on file  Transportation Needs: Not on file  Physical Activity: Not on file  Stress: Not on file  Social Connections: Not on file    Tobacco Counseling Counseling given: Not Answered   Clinical Intake:  Pre-visit preparation completed: Yes  Pain : No/denies pain Pain Score: 0-No pain     BMI - recorded: 22.83 Nutritional Status: BMI of 19-24  Normal Nutritional Risks: None Diabetes: No  How often do you  need to have  someone help you when you read instructions, pamphlets, or other written materials from your doctor or pharmacy?: 5 - Always What is the last grade level you completed in school?: college  Diabetic?no  Interpreter Needed?: No  Information entered by :: Cortney Beissel Bretta Bang NP   Activities of Daily Living In your present state of health, do you have any difficulty performing the following activities: 03/03/2021  Hearing? Y  Vision? N  Difficulty concentrating or making decisions? Y  Walking or climbing stairs? Y  Dressing or bathing? Y  Doing errands, shopping? Y  Preparing Food and eating ? N  Using the Toilet? N  In the past six months, have you accidently leaked urine? Y  Do you have problems with loss of bowel control? N  Managing your Medications? Y  Managing your Finances? Y  Housekeeping or managing your Housekeeping? Y  Some recent data might be hidden    Patient Care Team: [name], MD as PCP - General (Internal Medicine)  Indicate any recent Medical Services you may have received from other than [facility] providers in the past year (date may be approximate).     Assessment:   This is a routine wellness examination for [place].  Hearing/Vision screen No results found.  Dietary issues and exercise activities discussed: Current Exercise Habits: The patient does not participate in regular exercise at present, Exercise limited by: psychological condition(s)   Goals Addressed             This Visit's Progress    Cognitive Function Enhanced       Evidence-based guidance:  Assess changes in cognitive function using standardized assessment when available.  Encourage and refer for services that stimulate thinking, problem-solving and memory, such as cognitive training and cognitive rehabilitation.  Encourage physical activity or exercise based on ability and tolerance.  Encourage enjoyable activities that provide general stimulation for thinking, concentration and memory  in a social setting or small group.   Notes:        Depression Screen No flowsheet data found.  Fall Risk No flowsheet data found.  FALL RISK PREVENTION PERTAINING TO THE HOME:  Any stairs in or around the home? Yes  If so, are there any without handrails? No  Home free of loose throw rugs in walkways, pet beds, electrical cords, etc? Yes  Adequate lighting in your home to reduce risk of falls? Yes   ASSISTIVE DEVICES UTILIZED TO PREVENT FALLS:  Life alert? No  Use of a cane, walker or w/c? Yes  Grab bars in the bathroom? Yes  Shower chair or bench in shower? Yes  Elevated toilet seat or a handicapped toilet? Yes   TIMED UP AND GO:  Was the test performed? Yes .  Length of time to ambulate 10 feet: 10 sec.   Gait steady and fast with assistive device  Cognitive Function:        Immunizations Immunization History  Administered Date(s) Administered   Influenza Inj Mdck Quad With Preservative 10/11/2015   Influenza, High Dose Seasonal PF 11/21/2013   Influenza, Quadrivalent, Recombinant, Inj, Pf 10/01/2017, 10/12/2018, 10/27/2019   Influenza-Unspecified 11/22/2014, 11/12/2020   PFIZER(Purple Top)SARS-COV-2 Vaccination 02/16/2019, 03/09/2019, 10/26/2019, 06/24/2020, 10/14/2020   PPD Test 10/23/2005   Pneumococcal Conjugate-13 08/08/2013   Pneumococcal Polysaccharide-23 12/27/2011, 01/25/2018   Tdap 11/28/2009   Zoster, Live 03/15/2005   Zoster, Unspecified 09/01/2016, 12/02/2016    TDAP status: Up to date  Flu Vaccine status: Up to date  Pneumococcal  vaccine status: Up to date  Covid-19 vaccine status: Completed vaccines  Qualifies for Shingles Vaccine? Yes   Zostavax completed Yes   Shingrix Completed?: Yes  Screening Tests Health Maintenance  Topic Date Due   Hepatitis C Screening  Never done   Zoster Vaccines- Shingrix (1 of 2) 09/06/1993   DEXA SCAN  Never done   TETANUS/TDAP  11/29/2019   Pneumonia Vaccine 85+ Years old   Completed   INFLUENZA VACCINE  Completed   COVID-19 Vaccine  Completed   HPV VACCINES  Aged Out    Health Maintenance  Health Maintenance Due  Topic Date Due   Hepatitis C Screening  Never done   Zoster Vaccines- Shingrix (1 of 2) 09/06/1993   DEXA SCAN  Never done   TETANUS/TDAP  11/29/2019    Colorectal cancer screening: No longer required.   Mammogram status: No longer required due to aged out.  Bone Density status: Ordered DEXA. Pt provided with contact info and advised to call to schedule appt.  Lung Cancer Screening: (Low Dose CT Chest recommended if Age 55-80 years, 30 pack-year currently smoking OR have quit w/in 15years.) does not qualify.    Additional Screening:  Hepatitis C Screening: does not qualify  Vision Screening: Recommended annual ophthalmology exams for early detection of glaucoma and other disorders of the eye. Is the patient up to date with their annual eye exam?  No  Who is the provider or what is the name of the office in which the patient attends annual eye exams? Refer to ophthalmology if desires.  If pt is not established with a provider, would they like to be referred to a provider to establish care? No .   Dental Screening: Recommended annual dental exams for proper oral hygiene  Community Resource Referral / Chronic Care Management: CRR required this visit?  No   CCM required this visit?  No      Plan:    Will update MMSE, DEXA, f/u Ophthalmology.   I have personally reviewed and noted the following in the patients chart:   Medical and social history Use of alcohol, tobacco or illicit drugs  Current medications and supplements including opioid prescriptions.  Functional ability and status Nutritional status Physical activity Advanced directives List of other physicians Hospitalizations, surgeries, and ER visits in previous 12 months Vitals Screenings to include cognitive, depression, and falls Referrals and  appointments  In addition, I have reviewed and discussed with patient certain preventive protocols, quality metrics, and best practice recommendations. A written personalized care plan for preventive services as well as general preventive health recommendations were provided to patient.     Santino Kinsella X Mida Cory, NP   03/03/2021

## 2021-03-03 ENCOUNTER — Encounter: Payer: Self-pay | Admitting: Nurse Practitioner

## 2021-03-04 DIAGNOSIS — N3946 Mixed incontinence: Secondary | ICD-10-CM | POA: Diagnosis not present

## 2021-03-04 DIAGNOSIS — M228X1 Other disorders of patella, right knee: Secondary | ICD-10-CM | POA: Diagnosis not present

## 2021-03-04 DIAGNOSIS — M810 Age-related osteoporosis without current pathological fracture: Secondary | ICD-10-CM | POA: Diagnosis not present

## 2021-03-04 DIAGNOSIS — L988 Other specified disorders of the skin and subcutaneous tissue: Secondary | ICD-10-CM | POA: Diagnosis not present

## 2021-03-04 DIAGNOSIS — M6281 Muscle weakness (generalized): Secondary | ICD-10-CM | POA: Diagnosis not present

## 2021-03-04 DIAGNOSIS — R29898 Other symptoms and signs involving the musculoskeletal system: Secondary | ICD-10-CM | POA: Diagnosis not present

## 2021-03-05 DIAGNOSIS — R29898 Other symptoms and signs involving the musculoskeletal system: Secondary | ICD-10-CM | POA: Diagnosis not present

## 2021-03-05 DIAGNOSIS — N3946 Mixed incontinence: Secondary | ICD-10-CM | POA: Diagnosis not present

## 2021-03-05 DIAGNOSIS — M6281 Muscle weakness (generalized): Secondary | ICD-10-CM | POA: Diagnosis not present

## 2021-03-05 DIAGNOSIS — M810 Age-related osteoporosis without current pathological fracture: Secondary | ICD-10-CM | POA: Diagnosis not present

## 2021-03-05 DIAGNOSIS — L988 Other specified disorders of the skin and subcutaneous tissue: Secondary | ICD-10-CM | POA: Diagnosis not present

## 2021-03-05 DIAGNOSIS — M228X1 Other disorders of patella, right knee: Secondary | ICD-10-CM | POA: Diagnosis not present

## 2021-03-06 DIAGNOSIS — F411 Generalized anxiety disorder: Secondary | ICD-10-CM | POA: Diagnosis not present

## 2021-03-06 DIAGNOSIS — F02C11 Dementia in other diseases classified elsewhere, severe, with agitation: Secondary | ICD-10-CM | POA: Diagnosis not present

## 2021-03-09 ENCOUNTER — Non-Acute Institutional Stay (SKILLED_NURSING_FACILITY): Payer: Medicare PPO | Admitting: Nurse Practitioner

## 2021-03-09 ENCOUNTER — Encounter: Payer: Self-pay | Admitting: Nurse Practitioner

## 2021-03-09 DIAGNOSIS — N3946 Mixed incontinence: Secondary | ICD-10-CM | POA: Diagnosis not present

## 2021-03-09 DIAGNOSIS — F0280 Dementia in other diseases classified elsewhere without behavioral disturbance: Secondary | ICD-10-CM | POA: Diagnosis not present

## 2021-03-09 DIAGNOSIS — E538 Deficiency of other specified B group vitamins: Secondary | ICD-10-CM

## 2021-03-09 DIAGNOSIS — E559 Vitamin D deficiency, unspecified: Secondary | ICD-10-CM

## 2021-03-09 DIAGNOSIS — E785 Hyperlipidemia, unspecified: Secondary | ICD-10-CM

## 2021-03-09 DIAGNOSIS — R1312 Dysphagia, oropharyngeal phase: Secondary | ICD-10-CM | POA: Diagnosis not present

## 2021-03-09 DIAGNOSIS — G309 Alzheimer's disease, unspecified: Secondary | ICD-10-CM | POA: Diagnosis not present

## 2021-03-09 DIAGNOSIS — F418 Other specified anxiety disorders: Secondary | ICD-10-CM | POA: Diagnosis not present

## 2021-03-09 DIAGNOSIS — M810 Age-related osteoporosis without current pathological fracture: Secondary | ICD-10-CM

## 2021-03-09 DIAGNOSIS — R29898 Other symptoms and signs involving the musculoskeletal system: Secondary | ICD-10-CM | POA: Diagnosis not present

## 2021-03-09 DIAGNOSIS — M6281 Muscle weakness (generalized): Secondary | ICD-10-CM | POA: Diagnosis not present

## 2021-03-09 DIAGNOSIS — M228X1 Other disorders of patella, right knee: Secondary | ICD-10-CM | POA: Diagnosis not present

## 2021-03-09 DIAGNOSIS — L988 Other specified disorders of the skin and subcutaneous tissue: Secondary | ICD-10-CM | POA: Diagnosis not present

## 2021-03-09 NOTE — Progress Notes (Signed)
Location:  [place] Room Number: N110/A Place of Service:  SNF (31) Provider:  Franciso Dierks X, NP  Patient Care Team: [name], MD as PCP - General (Internal Medicine)  Extended Emergency Contact Information Primary Emergency Contact: Biederman,[name] Address: [address]          [place], [place] [address] [place] Phone: [phone] Relation: Spouse Secondary Emergency Contact: Codner,[name] Mobile Phone: [phone] Relation: Other  Code Status:  DNR Goals of care: Advanced Directive information Advanced Directives 03/09/2021  Does Patient Have a Medical Advance Directive? Yes  Type of Paramedic of [place];Living will;Out of facility DNR (pink MOST or yellow form)  Does patient want to make changes to medical advance directive? No - Patient declined  Copy of [place] in Chart? Yes - validated most recent copy scanned in chart (See row information)     Chief Complaint  Patient presents with   Medical Management of Chronic Issues    Routine visit. Discuss Hepatitis C screening and Dexa scan.   immunizations    Discuss Shingrix and TDAP vaccines.     HPI:  Pt is a 76 y.o. female seen today for medical management of chronic diseases.     Alzheimer's dementia, takes Donepezil, Memantine,  no CT/MRI of brain located. F/u [facility] memory care center, MMSE 17/30. TSH 2.04 12/03/20             Hyperlipidemia, takes Rosuvastatin, LDL 86 06/17/20             Depression/anxiety, takes Escitalopram Seroquel, Depakote, Clonazepam-did not tolerate GDR, valproic acid 36 12/04/20. Na 135 02/12/21             Vit B12 deficiency, takes Vit B 12 3000mg  qd, Vit B12 level 551 06/17/20. Hgb 13.6 02/22/21             Vit D deficiency, takes Vit D             OP, takes Alendronate(started 2019 after foot fx) needs DEXA 2023             Dysphagia, diet modification, speech therapy.   Past Medical  History:  Diagnosis Date   Abnormal weight loss    Age-related osteoporosis without current pathological fracture    Allergic rhinitis    Atherosclerosis of aorta (HCC)    Chronic obstructive pulmonary disease (COPD) (HCC)    Corns and callosities    Depression    H/O mammogram 2021   Per [facility] New Patient Packet   Hyperlipemia    Hyperlipidemia    Melanocytic nevi, unspecified    Mild cognitive impairment, so stated    Osteopenia    Osteoporosis    Other specified disorders of bone density and structure, unspecified site    Pain in unspecified ankle and joints of unspecified foot    Past Surgical History:  Procedure Laterality Date   COLONOSCOPY  2020   Per [facility] New Patient Packet    Allergies  Allergen Reactions   Sertraline Other (See Comments)    Suicidal thoughts   Penicillin G Rash    Welts    Outpatient Encounter Medications as of 03/09/2021  Medication Sig   acetaminophen (TYLENOL) 325 MG tablet Take 650 mg by mouth 2 (two) times daily as needed.   alendronate (FOSAMAX) 70 MG tablet Take 70 mg by mouth once a week. Take with a full glass of water on an empty stomach.  calcium carbonate (OSCAL) 1500 (600 Ca) MG TABS tablet Take 600 mg by mouth 2 (two) times daily with a meal.   Cholecalciferol (VITAMIN D) 50 MCG (2000 UT) tablet Take 2,000 Units by mouth daily.   clonazePAM (KLONOPIN) 0.5 MG tablet Take 1 tablet (0.5 mg total) by mouth 2 (two) times daily.   CYANOCOBALAMIN PO Take 3,000 mcg by mouth daily.   divalproex (DEPAKOTE) 125 MG DR tablet Take 125 mg by mouth daily.   divalproex (DEPAKOTE) 125 MG DR tablet Take 250 mg by mouth 2 (two) times daily.   donepezil (ARICEPT) 5 MG tablet    escitalopram (LEXAPRO) 10 MG tablet Take 10 mg by mouth daily.   memantine (NAMENDA) 10 MG tablet Take 10 mg by mouth 2 (two) times daily.   Multiple Vitamin (MULTIVITAMIN) capsule Take by mouth daily. 400 mcg; amt: 1Tablet   QUEtiapine (SEROQUEL) 25 MG tablet Take 25 mg by  mouth 2 (two) times daily.   rosuvastatin (CRESTOR) 10 MG tablet Take 10 mg by mouth daily.   [DISCONTINUED] clonazePAM (KLONOPIN) 0.5 MG tablet Take 1 tablet (0.5 mg total) by mouth daily as needed for anxiety.   No facility-administered encounter medications on file as of 03/09/2021.    Review of Systems  Constitutional:  Negative for fatigue, fever and unexpected weight change.       Weight gained #7Ibs in one week?  HENT:  Negative for congestion, hearing loss and trouble swallowing.   Eyes:  Negative for visual disturbance.  Respiratory:  Negative for shortness of breath.   Cardiovascular:  Negative for leg swelling.  Gastrointestinal:  Negative for abdominal pain and constipation.  Genitourinary:  Negative for dysuria, frequency and urgency.  Musculoskeletal:  Positive for arthralgias. Negative for gait problem and joint swelling.          Skin:  Negative for color change.  Neurological:  Negative for speech difficulty, weakness and headaches.       Memory lapses.   Psychiatric/Behavioral:  Positive for agitation, behavioral problems, confusion and sleep disturbance. The patient is nervous/anxious.        Stabilizing.    Immunization History  Administered Date(s) Administered   Influenza Inj Mdck Quad With Preservative 10/11/2015   Influenza, High Dose Seasonal PF 11/21/2013   Influenza, Quadrivalent, Recombinant, Inj, Pf 10/01/2017, 10/12/2018, 10/27/2019   Influenza-Unspecified 11/22/2014, 11/12/2020   PFIZER(Purple Top)SARS-COV-2 Vaccination 02/16/2019, 03/09/2019, 10/26/2019, 06/24/2020, 10/14/2020   PPD Test 10/23/2005   Pneumococcal Conjugate-13 08/08/2013   Pneumococcal Polysaccharide-23 12/27/2011, 01/25/2018   Tdap 11/28/2009   Zoster, Live 03/15/2005   Zoster, Unspecified 09/01/2016, 12/02/2016   Pertinent  Health Maintenance Due  Topic Date Due   DEXA SCAN  Never done   INFLUENZA VACCINE  Completed   No flowsheet data found. Functional Status Survey:     Vitals:   03/09/21 1541  BP: 114/68  Pulse: 74  Resp: 18  Temp: (!) 97.1 F (36.2 C)  SpO2: 97%  Weight: 136 lb 12.8 oz (62.1 kg)  Height: 5\' 5"  (1.651 m)   Body mass index is 22.76 kg/m. Physical Exam Vitals and nursing note reviewed.  Constitutional:      Appearance: Normal appearance.  HENT:     Head: Normocephalic and atraumatic.     Mouth/Throat:     Mouth: Mucous membranes are moist.  Eyes:     Extraocular Movements: Extraocular movements intact.     Conjunctiva/sclera: Conjunctivae normal.     Pupils: Pupils are equal, round, and reactive to light.  Cardiovascular:     Rate and Rhythm: Normal rate and regular rhythm.     Heart sounds: No murmur heard. Pulmonary:     Effort: Pulmonary effort is normal.     Breath sounds: No rales.  Abdominal:     General: Bowel sounds are normal.     Palpations: Abdomen is soft.     Tenderness: There is no abdominal tenderness.  Musculoskeletal:        General: Swelling, tenderness and signs of injury present.     Cervical back: Normal range of motion and neck supple.     Right lower leg: No edema.     Left lower leg: No edema.     Comments:  warmth, effusion mostly lateral aspect, painful with certain way of her walking.    Skin:    General: Skin is warm and dry.  Neurological:     General: No focal deficit present.     Mental Status: She is alert. Mental status is at baseline.     Gait: Gait normal.     Comments: Oriented to person  Psychiatric:     Comments: Smile, followed simple directions during my examination today    Labs reviewed: Recent Labs    08/20/20 0000 12/03/20 0000 02/12/21 0000  NA 138 140 135*  K 4.1 4.4 4.2  CL 99 101 99  CO2 29* 24* 27*  BUN 14 16 13   CREATININE 0.7 0.7 0.7  CALCIUM 9.4 9.9 9.2   Recent Labs    08/20/20 0000 12/03/20 0000 02/12/21 0000  AST 22 23 30   ALT 20 19 25   ALKPHOS 57 71 66  ALBUMIN 3.7 4.1 3.6   Recent Labs    07/16/20 0000 08/20/20 0000  12/03/20 0000 02/12/21 0000  WBC 7.6 7.6 7.3 6.4  NEUTROABS 3,762.00 3,595.00 3,811.00  --   HGB 15.0 13.9 14.7 13.6  HCT 46 41 44 40  PLT 388 294 276 274   Lab Results  Component Value Date   TSH 2.04 12/03/2020   No results found for: HGBA1C Lab Results  Component Value Date   CHOL 178 06/18/2020   HDL 77 (A) 06/18/2020   LDLCALC 86 06/18/2020   TRIG 61 06/18/2020    Significant Diagnostic Results in last 30 days:  No results found.  Assessment/Plan Hyperlipidemia takes Rosuvastatin, LDL 86 06/17/20  Depression with anxiety takes Escitalopram Seroquel, Depakote, Clonazepam-did not tolerate GDR, valproic acid 36 12/04/20. Na 135 02/12/21  Vitamin B12 deficiency takes Vit B 12 3000mg  qd, Vit B12 level 551 06/17/20. Hgb 13.6 02/22/21  Vitamin D deficiency takes Vit D  Osteoporosis takes Alendronate(started 2019 after foot fx) needs DEXA 2023  Dysphagia  diet modification, speech therapy.   Alzheimer disease ([place])  takes Donepezil, Memantine,  no CT/MRI of brain located. F/u [facility] memory care center, MMSE 17/30. TSH 2.04 12/03/20   Shingrix, Tdap recommended.   Family/ staff Communication: plan of care reviewed with the patient and charge nurse.   Labs/tests ordered: DEXA  Time spend 35 minutes.

## 2021-03-10 ENCOUNTER — Encounter: Payer: Self-pay | Admitting: Nurse Practitioner

## 2021-03-10 DIAGNOSIS — M228X1 Other disorders of patella, right knee: Secondary | ICD-10-CM | POA: Diagnosis not present

## 2021-03-10 DIAGNOSIS — N3946 Mixed incontinence: Secondary | ICD-10-CM | POA: Diagnosis not present

## 2021-03-10 DIAGNOSIS — L988 Other specified disorders of the skin and subcutaneous tissue: Secondary | ICD-10-CM | POA: Diagnosis not present

## 2021-03-10 DIAGNOSIS — R29898 Other symptoms and signs involving the musculoskeletal system: Secondary | ICD-10-CM | POA: Diagnosis not present

## 2021-03-10 DIAGNOSIS — M810 Age-related osteoporosis without current pathological fracture: Secondary | ICD-10-CM | POA: Diagnosis not present

## 2021-03-10 DIAGNOSIS — M6281 Muscle weakness (generalized): Secondary | ICD-10-CM | POA: Diagnosis not present

## 2021-03-10 NOTE — Assessment & Plan Note (Signed)
takes Donepezil, Memantine,  no CT/MRI of brain located. F/u [facility] memory care center, MMSE 17/30. TSH 2.04 12/03/20

## 2021-03-10 NOTE — Assessment & Plan Note (Signed)
diet modification, speech therapy.

## 2021-03-10 NOTE — Assessment & Plan Note (Signed)
takes Rosuvastatin, LDL 86 06/17/20

## 2021-03-10 NOTE — Assessment & Plan Note (Signed)
takes Vit B 12 3000mg  qd, Vit B12 level 551 06/17/20. Hgb 13.6 02/22/21

## 2021-03-10 NOTE — Assessment & Plan Note (Signed)
takes Escitalopram Seroquel, Depakote, Clonazepam-did not tolerate GDR, valproic acid 36 12/04/20. Na 135 02/12/21

## 2021-03-10 NOTE — Assessment & Plan Note (Signed)
takes Vit D 

## 2021-03-10 NOTE — Assessment & Plan Note (Signed)
takes Alendronate(started 2019 after foot fx) needs DEXA 2023

## 2021-03-12 DIAGNOSIS — M6281 Muscle weakness (generalized): Secondary | ICD-10-CM | POA: Diagnosis not present

## 2021-03-12 DIAGNOSIS — N3946 Mixed incontinence: Secondary | ICD-10-CM | POA: Diagnosis not present

## 2021-03-12 DIAGNOSIS — M810 Age-related osteoporosis without current pathological fracture: Secondary | ICD-10-CM | POA: Diagnosis not present

## 2021-03-12 DIAGNOSIS — R29898 Other symptoms and signs involving the musculoskeletal system: Secondary | ICD-10-CM | POA: Diagnosis not present

## 2021-03-12 DIAGNOSIS — L988 Other specified disorders of the skin and subcutaneous tissue: Secondary | ICD-10-CM | POA: Diagnosis not present

## 2021-03-12 DIAGNOSIS — M228X1 Other disorders of patella, right knee: Secondary | ICD-10-CM | POA: Diagnosis not present

## 2021-03-13 ENCOUNTER — Telehealth: Payer: Self-pay

## 2021-03-13 DIAGNOSIS — G309 Alzheimer's disease, unspecified: Secondary | ICD-10-CM

## 2021-03-13 NOTE — Telephone Encounter (Signed)
Incoming fax was received from [facility] written order was sent indicating ManXie Mast, NP gave an order for Clonazepam 0.5 mg daily as needed #30  Order was signed and dated on 03/12/2021 by [facility]   I have documented this as a historical order in Epic

## 2021-03-15 DIAGNOSIS — M6281 Muscle weakness (generalized): Secondary | ICD-10-CM | POA: Diagnosis not present

## 2021-03-15 DIAGNOSIS — L988 Other specified disorders of the skin and subcutaneous tissue: Secondary | ICD-10-CM | POA: Diagnosis not present

## 2021-03-15 DIAGNOSIS — M810 Age-related osteoporosis without current pathological fracture: Secondary | ICD-10-CM | POA: Diagnosis not present

## 2021-03-15 DIAGNOSIS — R29898 Other symptoms and signs involving the musculoskeletal system: Secondary | ICD-10-CM | POA: Diagnosis not present

## 2021-03-15 DIAGNOSIS — M228X1 Other disorders of patella, right knee: Secondary | ICD-10-CM | POA: Diagnosis not present

## 2021-03-15 DIAGNOSIS — N3946 Mixed incontinence: Secondary | ICD-10-CM | POA: Diagnosis not present

## 2021-03-16 ENCOUNTER — Encounter: Payer: Self-pay | Admitting: Nurse Practitioner

## 2021-03-16 DIAGNOSIS — L988 Other specified disorders of the skin and subcutaneous tissue: Secondary | ICD-10-CM | POA: Diagnosis not present

## 2021-03-16 DIAGNOSIS — R29898 Other symptoms and signs involving the musculoskeletal system: Secondary | ICD-10-CM | POA: Diagnosis not present

## 2021-03-16 DIAGNOSIS — M810 Age-related osteoporosis without current pathological fracture: Secondary | ICD-10-CM | POA: Diagnosis not present

## 2021-03-16 DIAGNOSIS — N3946 Mixed incontinence: Secondary | ICD-10-CM | POA: Diagnosis not present

## 2021-03-16 DIAGNOSIS — M228X1 Other disorders of patella, right knee: Secondary | ICD-10-CM | POA: Diagnosis not present

## 2021-03-16 DIAGNOSIS — M6281 Muscle weakness (generalized): Secondary | ICD-10-CM | POA: Diagnosis not present

## 2021-03-17 DIAGNOSIS — M228X1 Other disorders of patella, right knee: Secondary | ICD-10-CM | POA: Diagnosis not present

## 2021-03-17 DIAGNOSIS — R29898 Other symptoms and signs involving the musculoskeletal system: Secondary | ICD-10-CM | POA: Diagnosis not present

## 2021-03-17 DIAGNOSIS — L988 Other specified disorders of the skin and subcutaneous tissue: Secondary | ICD-10-CM | POA: Diagnosis not present

## 2021-03-17 DIAGNOSIS — M810 Age-related osteoporosis without current pathological fracture: Secondary | ICD-10-CM | POA: Diagnosis not present

## 2021-03-17 DIAGNOSIS — N3946 Mixed incontinence: Secondary | ICD-10-CM | POA: Diagnosis not present

## 2021-03-17 DIAGNOSIS — M6281 Muscle weakness (generalized): Secondary | ICD-10-CM | POA: Diagnosis not present

## 2021-03-18 ENCOUNTER — Other Ambulatory Visit: Payer: Self-pay | Admitting: Orthopedic Surgery

## 2021-03-18 DIAGNOSIS — M228X1 Other disorders of patella, right knee: Secondary | ICD-10-CM | POA: Diagnosis not present

## 2021-03-18 DIAGNOSIS — L988 Other specified disorders of the skin and subcutaneous tissue: Secondary | ICD-10-CM | POA: Diagnosis not present

## 2021-03-18 DIAGNOSIS — G309 Alzheimer's disease, unspecified: Secondary | ICD-10-CM

## 2021-03-18 DIAGNOSIS — M810 Age-related osteoporosis without current pathological fracture: Secondary | ICD-10-CM | POA: Diagnosis not present

## 2021-03-18 DIAGNOSIS — M6281 Muscle weakness (generalized): Secondary | ICD-10-CM | POA: Diagnosis not present

## 2021-03-18 DIAGNOSIS — N3946 Mixed incontinence: Secondary | ICD-10-CM | POA: Diagnosis not present

## 2021-03-18 DIAGNOSIS — R29898 Other symptoms and signs involving the musculoskeletal system: Secondary | ICD-10-CM | POA: Diagnosis not present

## 2021-03-18 MED ORDER — CLONAZEPAM 0.5 MG PO TABS: 0.5000 mg | ORAL_TABLET | Freq: Two times a day (BID) | ORAL | 0 refills | Status: DC

## 2021-03-19 DIAGNOSIS — M228X1 Other disorders of patella, right knee: Secondary | ICD-10-CM | POA: Diagnosis not present

## 2021-03-19 DIAGNOSIS — L988 Other specified disorders of the skin and subcutaneous tissue: Secondary | ICD-10-CM | POA: Diagnosis not present

## 2021-03-19 DIAGNOSIS — M810 Age-related osteoporosis without current pathological fracture: Secondary | ICD-10-CM | POA: Diagnosis not present

## 2021-03-19 DIAGNOSIS — R29898 Other symptoms and signs involving the musculoskeletal system: Secondary | ICD-10-CM | POA: Diagnosis not present

## 2021-03-19 DIAGNOSIS — N3946 Mixed incontinence: Secondary | ICD-10-CM | POA: Diagnosis not present

## 2021-03-19 DIAGNOSIS — M6281 Muscle weakness (generalized): Secondary | ICD-10-CM | POA: Diagnosis not present

## 2021-03-21 DIAGNOSIS — M6281 Muscle weakness (generalized): Secondary | ICD-10-CM | POA: Diagnosis not present

## 2021-03-21 DIAGNOSIS — M810 Age-related osteoporosis without current pathological fracture: Secondary | ICD-10-CM | POA: Diagnosis not present

## 2021-03-21 DIAGNOSIS — M228X1 Other disorders of patella, right knee: Secondary | ICD-10-CM | POA: Diagnosis not present

## 2021-03-21 DIAGNOSIS — R29898 Other symptoms and signs involving the musculoskeletal system: Secondary | ICD-10-CM | POA: Diagnosis not present

## 2021-03-21 DIAGNOSIS — L988 Other specified disorders of the skin and subcutaneous tissue: Secondary | ICD-10-CM | POA: Diagnosis not present

## 2021-03-21 DIAGNOSIS — N3946 Mixed incontinence: Secondary | ICD-10-CM | POA: Diagnosis not present

## 2021-03-22 DIAGNOSIS — L988 Other specified disorders of the skin and subcutaneous tissue: Secondary | ICD-10-CM | POA: Diagnosis not present

## 2021-03-22 DIAGNOSIS — M6281 Muscle weakness (generalized): Secondary | ICD-10-CM | POA: Diagnosis not present

## 2021-03-22 DIAGNOSIS — N3946 Mixed incontinence: Secondary | ICD-10-CM | POA: Diagnosis not present

## 2021-03-22 DIAGNOSIS — M228X1 Other disorders of patella, right knee: Secondary | ICD-10-CM | POA: Diagnosis not present

## 2021-03-22 DIAGNOSIS — R29898 Other symptoms and signs involving the musculoskeletal system: Secondary | ICD-10-CM | POA: Diagnosis not present

## 2021-03-22 DIAGNOSIS — M810 Age-related osteoporosis without current pathological fracture: Secondary | ICD-10-CM | POA: Diagnosis not present

## 2021-03-24 DIAGNOSIS — M810 Age-related osteoporosis without current pathological fracture: Secondary | ICD-10-CM | POA: Diagnosis not present

## 2021-03-24 DIAGNOSIS — R29898 Other symptoms and signs involving the musculoskeletal system: Secondary | ICD-10-CM | POA: Diagnosis not present

## 2021-03-24 DIAGNOSIS — M6281 Muscle weakness (generalized): Secondary | ICD-10-CM | POA: Diagnosis not present

## 2021-03-24 DIAGNOSIS — M228X1 Other disorders of patella, right knee: Secondary | ICD-10-CM | POA: Diagnosis not present

## 2021-03-24 DIAGNOSIS — L988 Other specified disorders of the skin and subcutaneous tissue: Secondary | ICD-10-CM | POA: Diagnosis not present

## 2021-03-24 DIAGNOSIS — N3946 Mixed incontinence: Secondary | ICD-10-CM | POA: Diagnosis not present

## 2021-03-25 DIAGNOSIS — M6281 Muscle weakness (generalized): Secondary | ICD-10-CM | POA: Diagnosis not present

## 2021-03-25 DIAGNOSIS — N3946 Mixed incontinence: Secondary | ICD-10-CM | POA: Diagnosis not present

## 2021-03-25 DIAGNOSIS — Z9181 History of falling: Secondary | ICD-10-CM | POA: Diagnosis not present

## 2021-03-25 DIAGNOSIS — M228X1 Other disorders of patella, right knee: Secondary | ICD-10-CM | POA: Diagnosis not present

## 2021-03-25 DIAGNOSIS — R29898 Other symptoms and signs involving the musculoskeletal system: Secondary | ICD-10-CM | POA: Diagnosis not present

## 2021-03-25 DIAGNOSIS — R2689 Other abnormalities of gait and mobility: Secondary | ICD-10-CM | POA: Diagnosis not present

## 2021-03-25 DIAGNOSIS — L988 Other specified disorders of the skin and subcutaneous tissue: Secondary | ICD-10-CM | POA: Diagnosis not present

## 2021-03-25 DIAGNOSIS — M810 Age-related osteoporosis without current pathological fracture: Secondary | ICD-10-CM | POA: Diagnosis not present

## 2021-03-26 DIAGNOSIS — R2689 Other abnormalities of gait and mobility: Secondary | ICD-10-CM | POA: Diagnosis not present

## 2021-03-26 DIAGNOSIS — Z9181 History of falling: Secondary | ICD-10-CM | POA: Diagnosis not present

## 2021-03-26 DIAGNOSIS — M6281 Muscle weakness (generalized): Secondary | ICD-10-CM | POA: Diagnosis not present

## 2021-03-26 DIAGNOSIS — R29898 Other symptoms and signs involving the musculoskeletal system: Secondary | ICD-10-CM | POA: Diagnosis not present

## 2021-03-26 DIAGNOSIS — N3946 Mixed incontinence: Secondary | ICD-10-CM | POA: Diagnosis not present

## 2021-03-26 DIAGNOSIS — M228X1 Other disorders of patella, right knee: Secondary | ICD-10-CM | POA: Diagnosis not present

## 2021-03-26 DIAGNOSIS — L988 Other specified disorders of the skin and subcutaneous tissue: Secondary | ICD-10-CM | POA: Diagnosis not present

## 2021-03-26 DIAGNOSIS — M810 Age-related osteoporosis without current pathological fracture: Secondary | ICD-10-CM | POA: Diagnosis not present

## 2021-03-27 DIAGNOSIS — R29898 Other symptoms and signs involving the musculoskeletal system: Secondary | ICD-10-CM | POA: Diagnosis not present

## 2021-03-27 DIAGNOSIS — M228X1 Other disorders of patella, right knee: Secondary | ICD-10-CM | POA: Diagnosis not present

## 2021-03-27 DIAGNOSIS — M6281 Muscle weakness (generalized): Secondary | ICD-10-CM | POA: Diagnosis not present

## 2021-03-27 DIAGNOSIS — M810 Age-related osteoporosis without current pathological fracture: Secondary | ICD-10-CM | POA: Diagnosis not present

## 2021-03-27 DIAGNOSIS — R2689 Other abnormalities of gait and mobility: Secondary | ICD-10-CM | POA: Diagnosis not present

## 2021-03-27 DIAGNOSIS — Z9181 History of falling: Secondary | ICD-10-CM | POA: Diagnosis not present

## 2021-03-27 DIAGNOSIS — L988 Other specified disorders of the skin and subcutaneous tissue: Secondary | ICD-10-CM | POA: Diagnosis not present

## 2021-03-27 DIAGNOSIS — N3946 Mixed incontinence: Secondary | ICD-10-CM | POA: Diagnosis not present

## 2021-03-30 DIAGNOSIS — L988 Other specified disorders of the skin and subcutaneous tissue: Secondary | ICD-10-CM | POA: Diagnosis not present

## 2021-03-30 DIAGNOSIS — N3946 Mixed incontinence: Secondary | ICD-10-CM | POA: Diagnosis not present

## 2021-03-30 DIAGNOSIS — R2689 Other abnormalities of gait and mobility: Secondary | ICD-10-CM | POA: Diagnosis not present

## 2021-03-30 DIAGNOSIS — R29898 Other symptoms and signs involving the musculoskeletal system: Secondary | ICD-10-CM | POA: Diagnosis not present

## 2021-03-30 DIAGNOSIS — M228X1 Other disorders of patella, right knee: Secondary | ICD-10-CM | POA: Diagnosis not present

## 2021-03-30 DIAGNOSIS — M810 Age-related osteoporosis without current pathological fracture: Secondary | ICD-10-CM | POA: Diagnosis not present

## 2021-03-30 DIAGNOSIS — Z9181 History of falling: Secondary | ICD-10-CM | POA: Diagnosis not present

## 2021-03-30 DIAGNOSIS — M6281 Muscle weakness (generalized): Secondary | ICD-10-CM | POA: Diagnosis not present

## 2021-03-31 DIAGNOSIS — R2689 Other abnormalities of gait and mobility: Secondary | ICD-10-CM | POA: Diagnosis not present

## 2021-03-31 DIAGNOSIS — M228X1 Other disorders of patella, right knee: Secondary | ICD-10-CM | POA: Diagnosis not present

## 2021-03-31 DIAGNOSIS — R29898 Other symptoms and signs involving the musculoskeletal system: Secondary | ICD-10-CM | POA: Diagnosis not present

## 2021-03-31 DIAGNOSIS — L988 Other specified disorders of the skin and subcutaneous tissue: Secondary | ICD-10-CM | POA: Diagnosis not present

## 2021-03-31 DIAGNOSIS — M6281 Muscle weakness (generalized): Secondary | ICD-10-CM | POA: Diagnosis not present

## 2021-03-31 DIAGNOSIS — F411 Generalized anxiety disorder: Secondary | ICD-10-CM | POA: Diagnosis not present

## 2021-03-31 DIAGNOSIS — Z9181 History of falling: Secondary | ICD-10-CM | POA: Diagnosis not present

## 2021-03-31 DIAGNOSIS — M810 Age-related osteoporosis without current pathological fracture: Secondary | ICD-10-CM | POA: Diagnosis not present

## 2021-03-31 DIAGNOSIS — N3946 Mixed incontinence: Secondary | ICD-10-CM | POA: Diagnosis not present

## 2021-03-31 DIAGNOSIS — F02C11 Dementia in other diseases classified elsewhere, severe, with agitation: Secondary | ICD-10-CM | POA: Diagnosis not present

## 2021-04-02 DIAGNOSIS — Z9181 History of falling: Secondary | ICD-10-CM | POA: Diagnosis not present

## 2021-04-02 DIAGNOSIS — M6281 Muscle weakness (generalized): Secondary | ICD-10-CM | POA: Diagnosis not present

## 2021-04-02 DIAGNOSIS — R2689 Other abnormalities of gait and mobility: Secondary | ICD-10-CM | POA: Diagnosis not present

## 2021-04-02 DIAGNOSIS — L988 Other specified disorders of the skin and subcutaneous tissue: Secondary | ICD-10-CM | POA: Diagnosis not present

## 2021-04-02 DIAGNOSIS — M228X1 Other disorders of patella, right knee: Secondary | ICD-10-CM | POA: Diagnosis not present

## 2021-04-02 DIAGNOSIS — N3946 Mixed incontinence: Secondary | ICD-10-CM | POA: Diagnosis not present

## 2021-04-02 DIAGNOSIS — M810 Age-related osteoporosis without current pathological fracture: Secondary | ICD-10-CM | POA: Diagnosis not present

## 2021-04-02 DIAGNOSIS — R29898 Other symptoms and signs involving the musculoskeletal system: Secondary | ICD-10-CM | POA: Diagnosis not present

## 2021-04-03 DIAGNOSIS — R2689 Other abnormalities of gait and mobility: Secondary | ICD-10-CM | POA: Diagnosis not present

## 2021-04-03 DIAGNOSIS — M6281 Muscle weakness (generalized): Secondary | ICD-10-CM | POA: Diagnosis not present

## 2021-04-03 DIAGNOSIS — R29898 Other symptoms and signs involving the musculoskeletal system: Secondary | ICD-10-CM | POA: Diagnosis not present

## 2021-04-03 DIAGNOSIS — Z9181 History of falling: Secondary | ICD-10-CM | POA: Diagnosis not present

## 2021-04-03 DIAGNOSIS — M228X1 Other disorders of patella, right knee: Secondary | ICD-10-CM | POA: Diagnosis not present

## 2021-04-03 DIAGNOSIS — M810 Age-related osteoporosis without current pathological fracture: Secondary | ICD-10-CM | POA: Diagnosis not present

## 2021-04-03 DIAGNOSIS — L988 Other specified disorders of the skin and subcutaneous tissue: Secondary | ICD-10-CM | POA: Diagnosis not present

## 2021-04-03 DIAGNOSIS — N3946 Mixed incontinence: Secondary | ICD-10-CM | POA: Diagnosis not present

## 2021-04-06 ENCOUNTER — Non-Acute Institutional Stay (SKILLED_NURSING_FACILITY): Payer: Medicare PPO | Admitting: Nurse Practitioner

## 2021-04-06 ENCOUNTER — Encounter: Payer: Self-pay | Admitting: Nurse Practitioner

## 2021-04-06 DIAGNOSIS — E785 Hyperlipidemia, unspecified: Secondary | ICD-10-CM | POA: Diagnosis not present

## 2021-04-06 DIAGNOSIS — R29898 Other symptoms and signs involving the musculoskeletal system: Secondary | ICD-10-CM | POA: Diagnosis not present

## 2021-04-06 DIAGNOSIS — M6281 Muscle weakness (generalized): Secondary | ICD-10-CM | POA: Diagnosis not present

## 2021-04-06 DIAGNOSIS — L988 Other specified disorders of the skin and subcutaneous tissue: Secondary | ICD-10-CM | POA: Diagnosis not present

## 2021-04-06 DIAGNOSIS — N3946 Mixed incontinence: Secondary | ICD-10-CM | POA: Diagnosis not present

## 2021-04-06 DIAGNOSIS — R296 Repeated falls: Secondary | ICD-10-CM | POA: Diagnosis not present

## 2021-04-06 DIAGNOSIS — F418 Other specified anxiety disorders: Secondary | ICD-10-CM | POA: Diagnosis not present

## 2021-04-06 DIAGNOSIS — W19XXXA Unspecified fall, initial encounter: Secondary | ICD-10-CM

## 2021-04-06 DIAGNOSIS — G309 Alzheimer's disease, unspecified: Secondary | ICD-10-CM | POA: Diagnosis not present

## 2021-04-06 DIAGNOSIS — E559 Vitamin D deficiency, unspecified: Secondary | ICD-10-CM

## 2021-04-06 DIAGNOSIS — M810 Age-related osteoporosis without current pathological fracture: Secondary | ICD-10-CM | POA: Diagnosis not present

## 2021-04-06 DIAGNOSIS — R2689 Other abnormalities of gait and mobility: Secondary | ICD-10-CM | POA: Diagnosis not present

## 2021-04-06 DIAGNOSIS — F0280 Dementia in other diseases classified elsewhere without behavioral disturbance: Secondary | ICD-10-CM

## 2021-04-06 DIAGNOSIS — R1312 Dysphagia, oropharyngeal phase: Secondary | ICD-10-CM

## 2021-04-06 DIAGNOSIS — M228X1 Other disorders of patella, right knee: Secondary | ICD-10-CM | POA: Diagnosis not present

## 2021-04-06 DIAGNOSIS — E538 Deficiency of other specified B group vitamins: Secondary | ICD-10-CM

## 2021-04-06 DIAGNOSIS — Z9181 History of falling: Secondary | ICD-10-CM | POA: Diagnosis not present

## 2021-04-06 NOTE — Assessment & Plan Note (Signed)
No apparent injury, therapy to evaluate and tx.  ?

## 2021-04-06 NOTE — Progress Notes (Signed)
Location:   SNF [place] Room Number: 110 Place of Service:  SNF (31) Provider: [facility] Welden Hausmann NP  [name], MD  Patient Care Team: [name], MD as PCP - General (Internal Medicine)  Extended Emergency Contact Information Primary Emergency Contact: Aguallo,[name] Address: [address]          [place], [place] [address] [name] Phone: [phone] Relation: Spouse Secondary Emergency Contact: Graefe,[name] Mobile Phone: [phone] Relation: Other  Code Status:  DNR Goals of care: Advanced Directive information Advanced Directives 03/09/2021  Does Patient Have a Medical Advance Directive? Yes  Type of Paramedic of [place];Living will;Out of facility DNR (pink MOST or yellow form)  Does patient want to make changes to medical advance directive? No - Patient declined  Copy of [place] in Chart? Yes - validated most recent copy scanned in chart (See row information)     Chief Complaint  Patient presents with   Medical Management of Chronic Issues    HPI:  Pt is a 76 y.o. female seen today for medical management of chronic diseases.    Fall 04/04/21 when the patient was found laying on the floor between bathroom and room on the right side. No apparent injury noted, the patient has no recollection of the event.  Alzheimer's dementia, takes Donepezil, Memantine,  no CT/MRI of brain located. F/u [facility] memory care center, MMSE 17/30. TSH 2.04 12/03/20             Hyperlipidemia, takes Rosuvastatin, LDL 86 06/17/20             Depression/anxiety, takes Escitalopram Seroquel, Depakote, Clonazepam-did not tolerate GDR, valproic acid 36 12/04/20. Na 135 02/12/21             Vit B12 deficiency, takes Vit B 12 '3000mg'$  qd, Vit B12 level 551 06/17/20. Hgb 13.6 02/12/21             Vit D deficiency, takes Vit D             OP, takes Alendronate(started 2019 after foot fx), 03/11/2021- DEXA- t score -2.256              Dysphagia, diet modification, speech therapy.     Past Medical History:  Diagnosis Date   Abnormal weight loss    Age-related osteoporosis without current pathological fracture    Allergic rhinitis    Atherosclerosis of aorta (HCC)    Chronic obstructive pulmonary disease (COPD) (HCC)    Corns and callosities    Depression    H/O mammogram 2021   Per [facility] New Patient Packet   Hyperlipemia    Hyperlipidemia    Melanocytic nevi, unspecified    Mild cognitive impairment, so stated    Osteopenia    Osteoporosis    Other specified disorders of bone density and structure, unspecified site    Pain in unspecified ankle and joints of unspecified foot    Past Surgical History:  Procedure Laterality Date   COLONOSCOPY  2020   Per [facility] New Patient Packet    Allergies  Allergen Reactions   Sertraline Other (See Comments)    Suicidal thoughts   Penicillin G Rash    Welts    Allergies as of 04/06/2021       Reactions   Sertraline Other (See Comments)   Suicidal thoughts   Penicillin G Rash   Welts        Medication List  Accurate as of April 06, 2021 11:50 AM. If you have any questions, ask your nurse or doctor.          acetaminophen 325 MG tablet Commonly known as: TYLENOL Take 650 mg by mouth 2 (two) times daily as needed.   alendronate 70 MG tablet Commonly known as: FOSAMAX Take 70 mg by mouth once a week. Take with a full glass of water on an empty stomach.   calcium carbonate 1500 (600 Ca) MG Tabs tablet Commonly known as: OSCAL Take 600 mg by mouth 2 (two) times daily with a meal.   clonazePAM 0.5 MG tablet Commonly known as: KLONOPIN Take 1 tablet (0.5 mg total) by mouth daily as needed for anxiety.   clonazePAM 0.5 MG tablet Commonly known as: KLONOPIN Take 1 tablet (0.5 mg total) by mouth 2 (two) times daily.   CYANOCOBALAMIN PO Take 3,000 mcg by mouth daily.   divalproex 125 MG DR tablet Commonly known as: DEPAKOTE Take 125  mg by mouth daily.   divalproex 125 MG DR tablet Commonly known as: DEPAKOTE Take 250 mg by mouth 2 (two) times daily.   donepezil 5 MG tablet Commonly known as: ARICEPT   escitalopram 10 MG tablet Commonly known as: LEXAPRO Take 10 mg by mouth daily.   memantine 10 MG tablet Commonly known as: NAMENDA Take 10 mg by mouth 2 (two) times daily.   multivitamin capsule Take by mouth daily. 400 mcg; amt: 1Tablet   QUEtiapine 25 MG tablet Commonly known as: SEROQUEL Take 25 mg by mouth 2 (two) times daily.   rosuvastatin 10 MG tablet Commonly known as: CRESTOR Take 10 mg by mouth daily.   Vitamin D 50 MCG (2000 UT) tablet Take 2,000 Units by mouth daily.        Review of Systems  Unable to perform ROS: Dementia   Immunization History  Administered Date(s) Administered   Influenza Inj Mdck Quad With Preservative 10/11/2015   Influenza, High Dose Seasonal PF 11/21/2013   Influenza, Quadrivalent, Recombinant, Inj, Pf 10/01/2017, 10/12/2018, 10/27/2019   Influenza-Unspecified 11/22/2014, 11/12/2020   PFIZER(Purple Top)SARS-COV-2 Vaccination 02/16/2019, 03/09/2019, 10/26/2019, 06/24/2020, 10/14/2020   PPD Test 10/23/2005   Pneumococcal Conjugate-13 08/08/2013   Pneumococcal Polysaccharide-23 12/27/2011, 01/25/2018   Tdap 11/28/2009   Zoster, Live 03/15/2005   Zoster, Unspecified 09/01/2016, 12/02/2016   Pertinent  Health Maintenance Due  Topic Date Due   DEXA SCAN  Never done   INFLUENZA VACCINE  Completed   No flowsheet data found. Functional Status Survey:    Vitals:   04/06/21 1129  BP: 139/83  Pulse: 60  Resp: 20  Temp: (!) 97.2 F (36.2 C)  SpO2: 95%   There is no height or weight on file to calculate BMI. Physical Exam Vitals and nursing note reviewed.  Constitutional:      Appearance: Normal appearance.  HENT:     Head: Normocephalic and atraumatic.     Mouth/Throat:     Mouth: Mucous membranes are moist.  Eyes:     Extraocular Movements:  Extraocular movements intact.     Conjunctiva/sclera: Conjunctivae normal.     Pupils: Pupils are equal, round, and reactive to light.  Cardiovascular:     Rate and Rhythm: Normal rate and regular rhythm.     Heart sounds: No murmur heard. Pulmonary:     Effort: Pulmonary effort is normal.     Breath sounds: No rales.  Abdominal:     General: Bowel sounds are normal.  Palpations: Abdomen is soft.     Tenderness: There is no abdominal tenderness.  Musculoskeletal:     Cervical back: Normal range of motion and neck supple.     Right lower leg: No edema.     Left lower leg: No edema.     Comments:    Skin:    General: Skin is warm and dry.  Neurological:     General: No focal deficit present.     Mental Status: She is alert. Mental status is at baseline.     Gait: Gait normal.     Comments: Oriented to person  Psychiatric:     Comments: Smile, followed simple directions during my examination today    Labs reviewed: Recent Labs    08/20/20 0000 12/03/20 0000 02/12/21 0000  NA 138 140 135*  K 4.1 4.4 4.2  CL 99 101 99  CO2 29* 24* 27*  BUN '14 16 13  '$ CREATININE 0.7 0.7 0.7  CALCIUM 9.4 9.9 9.2   Recent Labs    08/20/20 0000 12/03/20 0000 02/12/21 0000  AST '22 23 30  '$ ALT '20 19 25  '$ ALKPHOS 57 71 66  ALBUMIN 3.7 4.1 3.6   Recent Labs    07/16/20 0000 08/20/20 0000 12/03/20 0000 02/12/21 0000  WBC 7.6 7.6 7.3 6.4  NEUTROABS 3,762.00 3,595.00 3,811.00  --   HGB 15.0 13.9 14.7 13.6  HCT 46 41 44 40  PLT 388 294 276 274   Lab Results  Component Value Date   TSH 2.04 12/03/2020   No results found for: HGBA1C Lab Results  Component Value Date   CHOL 178 06/18/2020   HDL 77 (A) 06/18/2020   LDLCALC 86 06/18/2020   TRIG 61 06/18/2020    Significant Diagnostic Results in last 30 days:  No results found.  Assessment/Plan  Fall No apparent injury, therapy to evaluate and tx.   Alzheimer disease ([place])  takes Donepezil, Memantine,  no CT/MRI of  brain located. F/u [facility] memory care center, MMSE 17/30. TSH 2.04 12/03/20. Limited safety awareness, impulsive, needs close supervision for safety.   Hyperlipidemia  takes Rosuvastatin, LDL 86 06/17/20  Depression with anxiety takes Escitalopram Seroquel, Depakote, Clonazepam-did not tolerate GDR, valproic acid 36 12/04/20. Na 135 02/12/21. GDR is goal of care when possible.   Vitamin B12 deficiency  takes Vit B 12 '3000mg'$  qd, Vit B12 level 551 06/17/20. Hgb 13.6 02/12/21  Vitamin D deficiency Takes Vit D supplement.   Osteoporosis takes Alendronate(started 2019 after foot fx), 03/11/2021- DEXA- t score -2.256  Dysphagia  diet modification, speech therapy.    Family/ staff Communication: plan of care reviewed with the patient and charge nurse.   Labs/tests ordered:  none  Time spend 35 minutes.

## 2021-04-06 NOTE — Assessment & Plan Note (Signed)
Takes Vit D supplement.  

## 2021-04-06 NOTE — Assessment & Plan Note (Signed)
takes Escitalopram Seroquel, Depakote, Clonazepam-did not tolerate GDR, valproic acid 36 12/04/20. Na 135 02/12/21. GDR is goal of care when possible.  ?

## 2021-04-06 NOTE — Assessment & Plan Note (Signed)
takes Donepezil, Memantine,  no CT/MRI of brain located. F/u [facility] memory care center, MMSE 17/30. TSH 2.04 12/03/20. Limited safety awareness, impulsive, needs close supervision for safety.  ?

## 2021-04-06 NOTE — Assessment & Plan Note (Addendum)
takes Alendronate(started 2019 after foot fx), 03/11/2021- DEXA- t score -2.256 ?

## 2021-04-06 NOTE — Assessment & Plan Note (Signed)
diet modification, speech therapy.  ?

## 2021-04-06 NOTE — Telephone Encounter (Signed)
Mast, Man X, NP  You 1 hour ago (3:08 PM)  ? ?Please delete it, thank you.    ? ? ? ?Allergies updated per [facility].  ?

## 2021-04-06 NOTE — Assessment & Plan Note (Signed)
takes Rosuvastatin, LDL 86 06/17/20 ?

## 2021-04-06 NOTE — Assessment & Plan Note (Signed)
takes Vit B 12 '3000mg'$  qd, Vit B12 level 551 06/17/20. Hgb 13.6 02/12/21 ?

## 2021-04-07 DIAGNOSIS — R2689 Other abnormalities of gait and mobility: Secondary | ICD-10-CM | POA: Diagnosis not present

## 2021-04-07 DIAGNOSIS — L988 Other specified disorders of the skin and subcutaneous tissue: Secondary | ICD-10-CM | POA: Diagnosis not present

## 2021-04-07 DIAGNOSIS — N3946 Mixed incontinence: Secondary | ICD-10-CM | POA: Diagnosis not present

## 2021-04-07 DIAGNOSIS — M228X1 Other disorders of patella, right knee: Secondary | ICD-10-CM | POA: Diagnosis not present

## 2021-04-07 DIAGNOSIS — R29898 Other symptoms and signs involving the musculoskeletal system: Secondary | ICD-10-CM | POA: Diagnosis not present

## 2021-04-07 DIAGNOSIS — M6281 Muscle weakness (generalized): Secondary | ICD-10-CM | POA: Diagnosis not present

## 2021-04-07 DIAGNOSIS — M810 Age-related osteoporosis without current pathological fracture: Secondary | ICD-10-CM | POA: Diagnosis not present

## 2021-04-07 DIAGNOSIS — Z9181 History of falling: Secondary | ICD-10-CM | POA: Diagnosis not present

## 2021-04-08 DIAGNOSIS — M6281 Muscle weakness (generalized): Secondary | ICD-10-CM | POA: Diagnosis not present

## 2021-04-08 DIAGNOSIS — M810 Age-related osteoporosis without current pathological fracture: Secondary | ICD-10-CM | POA: Diagnosis not present

## 2021-04-08 DIAGNOSIS — Z9181 History of falling: Secondary | ICD-10-CM | POA: Diagnosis not present

## 2021-04-08 DIAGNOSIS — R29898 Other symptoms and signs involving the musculoskeletal system: Secondary | ICD-10-CM | POA: Diagnosis not present

## 2021-04-08 DIAGNOSIS — R2689 Other abnormalities of gait and mobility: Secondary | ICD-10-CM | POA: Diagnosis not present

## 2021-04-08 DIAGNOSIS — L988 Other specified disorders of the skin and subcutaneous tissue: Secondary | ICD-10-CM | POA: Diagnosis not present

## 2021-04-08 DIAGNOSIS — M228X1 Other disorders of patella, right knee: Secondary | ICD-10-CM | POA: Diagnosis not present

## 2021-04-08 DIAGNOSIS — N3946 Mixed incontinence: Secondary | ICD-10-CM | POA: Diagnosis not present

## 2021-04-09 DIAGNOSIS — M810 Age-related osteoporosis without current pathological fracture: Secondary | ICD-10-CM | POA: Diagnosis not present

## 2021-04-09 DIAGNOSIS — M228X1 Other disorders of patella, right knee: Secondary | ICD-10-CM | POA: Diagnosis not present

## 2021-04-09 DIAGNOSIS — L988 Other specified disorders of the skin and subcutaneous tissue: Secondary | ICD-10-CM | POA: Diagnosis not present

## 2021-04-09 DIAGNOSIS — R2689 Other abnormalities of gait and mobility: Secondary | ICD-10-CM | POA: Diagnosis not present

## 2021-04-09 DIAGNOSIS — N3946 Mixed incontinence: Secondary | ICD-10-CM | POA: Diagnosis not present

## 2021-04-09 DIAGNOSIS — M6281 Muscle weakness (generalized): Secondary | ICD-10-CM | POA: Diagnosis not present

## 2021-04-09 DIAGNOSIS — R29898 Other symptoms and signs involving the musculoskeletal system: Secondary | ICD-10-CM | POA: Diagnosis not present

## 2021-04-09 DIAGNOSIS — Z9181 History of falling: Secondary | ICD-10-CM | POA: Diagnosis not present

## 2021-04-10 DIAGNOSIS — M810 Age-related osteoporosis without current pathological fracture: Secondary | ICD-10-CM | POA: Diagnosis not present

## 2021-04-10 DIAGNOSIS — M6281 Muscle weakness (generalized): Secondary | ICD-10-CM | POA: Diagnosis not present

## 2021-04-10 DIAGNOSIS — L988 Other specified disorders of the skin and subcutaneous tissue: Secondary | ICD-10-CM | POA: Diagnosis not present

## 2021-04-10 DIAGNOSIS — N3946 Mixed incontinence: Secondary | ICD-10-CM | POA: Diagnosis not present

## 2021-04-10 DIAGNOSIS — Z9181 History of falling: Secondary | ICD-10-CM | POA: Diagnosis not present

## 2021-04-10 DIAGNOSIS — R2689 Other abnormalities of gait and mobility: Secondary | ICD-10-CM | POA: Diagnosis not present

## 2021-04-10 DIAGNOSIS — R29898 Other symptoms and signs involving the musculoskeletal system: Secondary | ICD-10-CM | POA: Diagnosis not present

## 2021-04-10 DIAGNOSIS — M228X1 Other disorders of patella, right knee: Secondary | ICD-10-CM | POA: Diagnosis not present

## 2021-04-13 DIAGNOSIS — R2689 Other abnormalities of gait and mobility: Secondary | ICD-10-CM | POA: Diagnosis not present

## 2021-04-13 DIAGNOSIS — M6281 Muscle weakness (generalized): Secondary | ICD-10-CM | POA: Diagnosis not present

## 2021-04-13 DIAGNOSIS — N3946 Mixed incontinence: Secondary | ICD-10-CM | POA: Diagnosis not present

## 2021-04-13 DIAGNOSIS — M228X1 Other disorders of patella, right knee: Secondary | ICD-10-CM | POA: Diagnosis not present

## 2021-04-13 DIAGNOSIS — L988 Other specified disorders of the skin and subcutaneous tissue: Secondary | ICD-10-CM | POA: Diagnosis not present

## 2021-04-13 DIAGNOSIS — M810 Age-related osteoporosis without current pathological fracture: Secondary | ICD-10-CM | POA: Diagnosis not present

## 2021-04-13 DIAGNOSIS — R29898 Other symptoms and signs involving the musculoskeletal system: Secondary | ICD-10-CM | POA: Diagnosis not present

## 2021-04-13 DIAGNOSIS — Z9181 History of falling: Secondary | ICD-10-CM | POA: Diagnosis not present

## 2021-04-14 DIAGNOSIS — M6281 Muscle weakness (generalized): Secondary | ICD-10-CM | POA: Diagnosis not present

## 2021-04-14 DIAGNOSIS — N3946 Mixed incontinence: Secondary | ICD-10-CM | POA: Diagnosis not present

## 2021-04-14 DIAGNOSIS — L988 Other specified disorders of the skin and subcutaneous tissue: Secondary | ICD-10-CM | POA: Diagnosis not present

## 2021-04-14 DIAGNOSIS — M810 Age-related osteoporosis without current pathological fracture: Secondary | ICD-10-CM | POA: Diagnosis not present

## 2021-04-14 DIAGNOSIS — Z9181 History of falling: Secondary | ICD-10-CM | POA: Diagnosis not present

## 2021-04-14 DIAGNOSIS — M228X1 Other disorders of patella, right knee: Secondary | ICD-10-CM | POA: Diagnosis not present

## 2021-04-14 DIAGNOSIS — R29898 Other symptoms and signs involving the musculoskeletal system: Secondary | ICD-10-CM | POA: Diagnosis not present

## 2021-04-14 DIAGNOSIS — R2689 Other abnormalities of gait and mobility: Secondary | ICD-10-CM | POA: Diagnosis not present

## 2021-04-15 DIAGNOSIS — R29898 Other symptoms and signs involving the musculoskeletal system: Secondary | ICD-10-CM | POA: Diagnosis not present

## 2021-04-15 DIAGNOSIS — R2689 Other abnormalities of gait and mobility: Secondary | ICD-10-CM | POA: Diagnosis not present

## 2021-04-15 DIAGNOSIS — N3946 Mixed incontinence: Secondary | ICD-10-CM | POA: Diagnosis not present

## 2021-04-15 DIAGNOSIS — Z9181 History of falling: Secondary | ICD-10-CM | POA: Diagnosis not present

## 2021-04-15 DIAGNOSIS — M6281 Muscle weakness (generalized): Secondary | ICD-10-CM | POA: Diagnosis not present

## 2021-04-15 DIAGNOSIS — M228X1 Other disorders of patella, right knee: Secondary | ICD-10-CM | POA: Diagnosis not present

## 2021-04-15 DIAGNOSIS — M810 Age-related osteoporosis without current pathological fracture: Secondary | ICD-10-CM | POA: Diagnosis not present

## 2021-04-15 DIAGNOSIS — L988 Other specified disorders of the skin and subcutaneous tissue: Secondary | ICD-10-CM | POA: Diagnosis not present

## 2021-04-17 DIAGNOSIS — Z9181 History of falling: Secondary | ICD-10-CM | POA: Diagnosis not present

## 2021-04-17 DIAGNOSIS — L988 Other specified disorders of the skin and subcutaneous tissue: Secondary | ICD-10-CM | POA: Diagnosis not present

## 2021-04-17 DIAGNOSIS — M810 Age-related osteoporosis without current pathological fracture: Secondary | ICD-10-CM | POA: Diagnosis not present

## 2021-04-17 DIAGNOSIS — M6281 Muscle weakness (generalized): Secondary | ICD-10-CM | POA: Diagnosis not present

## 2021-04-17 DIAGNOSIS — M228X1 Other disorders of patella, right knee: Secondary | ICD-10-CM | POA: Diagnosis not present

## 2021-04-17 DIAGNOSIS — N3946 Mixed incontinence: Secondary | ICD-10-CM | POA: Diagnosis not present

## 2021-04-17 DIAGNOSIS — R2689 Other abnormalities of gait and mobility: Secondary | ICD-10-CM | POA: Diagnosis not present

## 2021-04-17 DIAGNOSIS — R29898 Other symptoms and signs involving the musculoskeletal system: Secondary | ICD-10-CM | POA: Diagnosis not present

## 2021-04-19 DIAGNOSIS — N3946 Mixed incontinence: Secondary | ICD-10-CM | POA: Diagnosis not present

## 2021-04-19 DIAGNOSIS — R2689 Other abnormalities of gait and mobility: Secondary | ICD-10-CM | POA: Diagnosis not present

## 2021-04-19 DIAGNOSIS — Z9181 History of falling: Secondary | ICD-10-CM | POA: Diagnosis not present

## 2021-04-19 DIAGNOSIS — L988 Other specified disorders of the skin and subcutaneous tissue: Secondary | ICD-10-CM | POA: Diagnosis not present

## 2021-04-19 DIAGNOSIS — M228X1 Other disorders of patella, right knee: Secondary | ICD-10-CM | POA: Diagnosis not present

## 2021-04-19 DIAGNOSIS — M6281 Muscle weakness (generalized): Secondary | ICD-10-CM | POA: Diagnosis not present

## 2021-04-19 DIAGNOSIS — R29898 Other symptoms and signs involving the musculoskeletal system: Secondary | ICD-10-CM | POA: Diagnosis not present

## 2021-04-19 DIAGNOSIS — M810 Age-related osteoporosis without current pathological fracture: Secondary | ICD-10-CM | POA: Diagnosis not present

## 2021-04-20 DIAGNOSIS — Z9181 History of falling: Secondary | ICD-10-CM | POA: Diagnosis not present

## 2021-04-20 DIAGNOSIS — M228X1 Other disorders of patella, right knee: Secondary | ICD-10-CM | POA: Diagnosis not present

## 2021-04-20 DIAGNOSIS — M810 Age-related osteoporosis without current pathological fracture: Secondary | ICD-10-CM | POA: Diagnosis not present

## 2021-04-20 DIAGNOSIS — L988 Other specified disorders of the skin and subcutaneous tissue: Secondary | ICD-10-CM | POA: Diagnosis not present

## 2021-04-20 DIAGNOSIS — M6281 Muscle weakness (generalized): Secondary | ICD-10-CM | POA: Diagnosis not present

## 2021-04-20 DIAGNOSIS — R2689 Other abnormalities of gait and mobility: Secondary | ICD-10-CM | POA: Diagnosis not present

## 2021-04-20 DIAGNOSIS — R29898 Other symptoms and signs involving the musculoskeletal system: Secondary | ICD-10-CM | POA: Diagnosis not present

## 2021-04-20 DIAGNOSIS — N3946 Mixed incontinence: Secondary | ICD-10-CM | POA: Diagnosis not present

## 2021-04-21 DIAGNOSIS — L988 Other specified disorders of the skin and subcutaneous tissue: Secondary | ICD-10-CM | POA: Diagnosis not present

## 2021-04-21 DIAGNOSIS — R2689 Other abnormalities of gait and mobility: Secondary | ICD-10-CM | POA: Diagnosis not present

## 2021-04-21 DIAGNOSIS — M228X1 Other disorders of patella, right knee: Secondary | ICD-10-CM | POA: Diagnosis not present

## 2021-04-21 DIAGNOSIS — R29898 Other symptoms and signs involving the musculoskeletal system: Secondary | ICD-10-CM | POA: Diagnosis not present

## 2021-04-21 DIAGNOSIS — Z9181 History of falling: Secondary | ICD-10-CM | POA: Diagnosis not present

## 2021-04-21 DIAGNOSIS — M6281 Muscle weakness (generalized): Secondary | ICD-10-CM | POA: Diagnosis not present

## 2021-04-21 DIAGNOSIS — M810 Age-related osteoporosis without current pathological fracture: Secondary | ICD-10-CM | POA: Diagnosis not present

## 2021-04-21 DIAGNOSIS — N3946 Mixed incontinence: Secondary | ICD-10-CM | POA: Diagnosis not present

## 2021-04-22 DIAGNOSIS — N3946 Mixed incontinence: Secondary | ICD-10-CM | POA: Diagnosis not present

## 2021-04-22 DIAGNOSIS — R2689 Other abnormalities of gait and mobility: Secondary | ICD-10-CM | POA: Diagnosis not present

## 2021-04-22 DIAGNOSIS — M810 Age-related osteoporosis without current pathological fracture: Secondary | ICD-10-CM | POA: Diagnosis not present

## 2021-04-22 DIAGNOSIS — Z9181 History of falling: Secondary | ICD-10-CM | POA: Diagnosis not present

## 2021-04-22 DIAGNOSIS — M6281 Muscle weakness (generalized): Secondary | ICD-10-CM | POA: Diagnosis not present

## 2021-04-22 DIAGNOSIS — M228X1 Other disorders of patella, right knee: Secondary | ICD-10-CM | POA: Diagnosis not present

## 2021-04-22 DIAGNOSIS — R29898 Other symptoms and signs involving the musculoskeletal system: Secondary | ICD-10-CM | POA: Diagnosis not present

## 2021-04-22 DIAGNOSIS — L988 Other specified disorders of the skin and subcutaneous tissue: Secondary | ICD-10-CM | POA: Diagnosis not present

## 2021-04-23 DIAGNOSIS — Z9181 History of falling: Secondary | ICD-10-CM | POA: Diagnosis not present

## 2021-04-23 DIAGNOSIS — R29898 Other symptoms and signs involving the musculoskeletal system: Secondary | ICD-10-CM | POA: Diagnosis not present

## 2021-04-23 DIAGNOSIS — N3946 Mixed incontinence: Secondary | ICD-10-CM | POA: Diagnosis not present

## 2021-04-23 DIAGNOSIS — L988 Other specified disorders of the skin and subcutaneous tissue: Secondary | ICD-10-CM | POA: Diagnosis not present

## 2021-04-23 DIAGNOSIS — M6281 Muscle weakness (generalized): Secondary | ICD-10-CM | POA: Diagnosis not present

## 2021-04-23 DIAGNOSIS — M228X1 Other disorders of patella, right knee: Secondary | ICD-10-CM | POA: Diagnosis not present

## 2021-04-23 DIAGNOSIS — M810 Age-related osteoporosis without current pathological fracture: Secondary | ICD-10-CM | POA: Diagnosis not present

## 2021-04-23 DIAGNOSIS — R2689 Other abnormalities of gait and mobility: Secondary | ICD-10-CM | POA: Diagnosis not present

## 2021-04-28 DIAGNOSIS — F411 Generalized anxiety disorder: Secondary | ICD-10-CM | POA: Diagnosis not present

## 2021-04-28 DIAGNOSIS — F02C11 Dementia in other diseases classified elsewhere, severe, with agitation: Secondary | ICD-10-CM | POA: Diagnosis not present

## 2021-05-19 ENCOUNTER — Encounter: Payer: Self-pay | Admitting: Internal Medicine

## 2021-05-19 ENCOUNTER — Non-Acute Institutional Stay (SKILLED_NURSING_FACILITY): Payer: Medicare PPO | Admitting: Internal Medicine

## 2021-05-19 DIAGNOSIS — E538 Deficiency of other specified B group vitamins: Secondary | ICD-10-CM | POA: Diagnosis not present

## 2021-05-19 DIAGNOSIS — F418 Other specified anxiety disorders: Secondary | ICD-10-CM

## 2021-05-19 DIAGNOSIS — L84 Corns and callosities: Secondary | ICD-10-CM | POA: Diagnosis not present

## 2021-05-19 DIAGNOSIS — E559 Vitamin D deficiency, unspecified: Secondary | ICD-10-CM | POA: Diagnosis not present

## 2021-05-19 DIAGNOSIS — G309 Alzheimer's disease, unspecified: Secondary | ICD-10-CM | POA: Diagnosis not present

## 2021-05-19 DIAGNOSIS — M810 Age-related osteoporosis without current pathological fracture: Secondary | ICD-10-CM

## 2021-05-19 DIAGNOSIS — F0280 Dementia in other diseases classified elsewhere without behavioral disturbance: Secondary | ICD-10-CM | POA: Diagnosis not present

## 2021-05-19 DIAGNOSIS — E785 Hyperlipidemia, unspecified: Secondary | ICD-10-CM

## 2021-05-19 DIAGNOSIS — B351 Tinea unguium: Secondary | ICD-10-CM | POA: Diagnosis not present

## 2021-05-19 DIAGNOSIS — M79671 Pain in right foot: Secondary | ICD-10-CM | POA: Diagnosis not present

## 2021-05-19 DIAGNOSIS — M79672 Pain in left foot: Secondary | ICD-10-CM | POA: Diagnosis not present

## 2021-05-19 NOTE — Progress Notes (Signed)
? ?Location:  [facility] ?Nursing Home Room Number: 110 ?Place of Service:  SNF (31) ? ?Provider: [name]  ? ?Code Status: DNR ?Goals of Care:  ? ?  03/09/2021  ?  3:42 PM  ?Advanced Directives  ?Does Patient Have a Medical Advance Directive? Yes  ?Type of Paramedic of [place];Living will;Out of facility DNR (pink MOST or yellow form)  ?Does patient want to make changes to medical advance directive? No - Patient declined  ?Copy of [place] in Chart? Yes - validated most recent copy scanned in chart (See row information)  ? ? ? ?Chief Complaint  ?Patient presents with  ? Medical Management of Chronic Issues  ? Quality Metric Gaps  ?  Verified [facility] patient is due for Hep C screen, Shingrix and dexa scan.   ? ? ?HPI: Patient is a 76 y.o. female seen today for medical management of chronic diseases.   ? ?  Patient has a history of Alzheimer's disease,, osteoporosis, vitamin D and B12 deficiency, hyperlipidemia, anxiety ?Before the admission patient Eloped her apartment and was found by the facility staff.  Her husband decided to move her to memory unit ? ?She  is stable. ?Did have one fall last month ?She is not able to now walk without assist ?Not able to use her walker due to Cognition decline ?Her behaviors and anxiety are much better now ?Her weight is stable ? ?Wt Readings from Last 3 Encounters:  ?05/19/21 138 lb 6.4 oz (62.8 kg)  ?03/09/21 136 lb 12.8 oz (62.1 kg)  ?03/02/21 137 lb 3.2 oz (62.2 kg)  ? ? ?Past Medical History:  ?Diagnosis Date  ? Abnormal weight loss   ? Age-related osteoporosis without current pathological fracture   ? Allergic rhinitis   ? Atherosclerosis of aorta ([place])   ? Chronic obstructive pulmonary disease (COPD) ([place])   ? Corns and callosities   ? Depression   ? H/O mammogram 2021  ? Per [facility] New Patient Packet  ? Hyperlipemia   ? Hyperlipidemia   ? Melanocytic nevi, unspecified   ? Mild cognitive impairment, so  stated   ? Osteopenia   ? Osteoporosis   ? Other specified disorders of bone density and structure, unspecified site   ? Pain in unspecified ankle and joints of unspecified foot   ? ? ?Past Surgical History:  ?Procedure Laterality Date  ? COLONOSCOPY  2020  ? Per [facility] New Patient Packet  ? ? ?Allergies  ?Allergen Reactions  ? Penicillin G Rash  ?  Welts  ? ? ?Outpatient Encounter Medications as of 05/19/2021  ?Medication Sig  ? acetaminophen (TYLENOL) 325 MG tablet Take 650 mg by mouth 2 (two) times daily as needed.  ? alendronate (FOSAMAX) 70 MG tablet Take 70 mg by mouth once a week. Take with a full glass of water on an empty stomach.  ? calcium carbonate (OSCAL) 1500 (600 Ca) MG TABS tablet Take 600 mg by mouth 2 (two) times daily with a meal.  ? Cholecalciferol (VITAMIN D) 50 MCG (2000 UT) tablet Take 2,000 Units by mouth daily.  ? clonazePAM (KLONOPIN) 0.5 MG tablet Take 1 tablet (0.5 mg total) by mouth daily as needed for anxiety.  ? clonazePAM (KLONOPIN) 0.5 MG tablet Take 1 tablet (0.5 mg total) by mouth 2 (two) times daily.  ? CYANOCOBALAMIN PO Take 3,000 mcg by mouth daily.  ? divalproex (DEPAKOTE) 125 MG DR tablet Take 125 mg by mouth  daily.  ? divalproex (DEPAKOTE) 125 MG DR tablet Take 250 mg by mouth 2 (two) times daily.  ? donepezil (ARICEPT) 5 MG tablet   ? escitalopram (LEXAPRO) 10 MG tablet Take 10 mg by mouth daily.  ? memantine (NAMENDA) 10 MG tablet Take 10 mg by mouth 2 (two) times daily.  ? Multiple Vitamin (MULTIVITAMIN) capsule Take by mouth daily. 400 mcg; amt: 1Tablet  ? QUEtiapine (SEROQUEL) 25 MG tablet Take 25 mg by mouth 2 (two) times daily.  ? rosuvastatin (CRESTOR) 10 MG tablet Take 10 mg by mouth daily.  ? ?No facility-administered encounter medications on file as of 05/19/2021.  ? ? ?Review of Systems:  ?Review of Systems  ?Unable to perform ROS: Dementia  ? ?Health Maintenance  ?Topic Date Due  ? Hepatitis C Screening  Never done  ? Zoster Vaccines- Shingrix (1 of 2) 09/06/1993  ?  DEXA SCAN  Never done  ? INFLUENZA VACCINE  08/25/2021  ? TETANUS/TDAP  03/16/2031  ? Pneumonia Vaccine 30+ Years old  Completed  ? COVID-19 Vaccine  Completed  ? HPV VACCINES  Aged Out  ? ? ?Physical Exam: ?Vitals:  ? 05/19/21 1437  ?BP: 116/74  ?Pulse: 60  ?Resp: 20  ?Temp: (!) 97.3 ?F (36.3 ?C)  ?SpO2: 97%  ?Weight: 138 lb 6.4 oz (62.8 kg)  ?Height: '5\' 5"'$  (1.651 m)  ? ?Body mass index is 23.03 kg/m?Marland Kitchen ?Physical Exam ?Vitals reviewed.  ?Constitutional:   ?   Appearance: Normal appearance.  ?HENT:  ?   Head: Normocephalic.  ?   Nose: Nose normal.  ?   Mouth/Throat:  ?   Mouth: Mucous membranes are moist.  ?   Pharynx: Oropharynx is clear.  ?Eyes:  ?   Pupils: Pupils are equal, round, and reactive to light.  ?Cardiovascular:  ?   Rate and Rhythm: Normal rate and regular rhythm.  ?   Pulses: Normal pulses.  ?   Heart sounds: Normal heart sounds. No murmur heard. ?Pulmonary:  ?   Effort: Pulmonary effort is normal.  ?   Breath sounds: Normal breath sounds.  ?Abdominal:  ?   General: Abdomen is flat. Bowel sounds are normal.  ?   Palpations: Abdomen is soft.  ?Musculoskeletal:     ?   General: No swelling.  ?   Cervical back: Neck supple.  ?Skin: ?   General: Skin is warm.  ?Neurological:  ?   General: No focal deficit present.  ?   Mental Status: She is alert.  ?   Comments: Responds and Follows [place]  ?Psychiatric:     ?   Mood and Affect: Mood normal.     ?   Thought Content: Thought content normal.  ? ? ?Labs reviewed: ?Basic Metabolic Panel: ?Recent Labs  ?  06/18/20 ?0000 07/16/20 ?0000 08/20/20 ?0000 12/03/20 ?0000 02/12/21 ?0000  ?NA 136*   < > 138 140 135*  ?K 4.1   < > 4.1 4.4 4.2  ?CL 100   < > 99 101 99  ?CO2 29*   < > 29* 24* 27*  ?BUN 12  12   < > '14 16 13  '$ ?CREATININE 0.8  0.8   < > 0.7 0.7 0.7  ?CALCIUM 9.3   < > 9.4 9.9 9.2  ?TSH 1.87  --   --  2.04  --   ? < > = values in this interval not displayed.  ? ?Liver Function Tests: ?Recent Labs  ?  08/20/20 ?0000 12/03/20 ?  0000 02/12/21 ?0000  ?AST '22  23 30  '$ ?ALT '20 19 25  '$ ?ALKPHOS 57 71 66  ?ALBUMIN 3.7 4.1 3.6  ? ?No results for input(s): LIPASE, AMYLASE in the last 8760 hours. ?No results for input(s): AMMONIA in the last 8760 hours. ?CBC: ?Recent Labs  ?  07/16/20 ?0000 08/20/20 ?0000 12/03/20 ?0000 02/12/21 ?0000  ?WBC 7.6 7.6 7.3 6.4  ?NEUTROABS 3,762.00 3,595.00 3,811.00  --   ?HGB 15.0 13.9 14.7 13.6  ?HCT 46 41 44 40  ?PLT 388 294 276 274  ? ?Lipid Panel: ?Recent Labs  ?  06/18/20 ?0000  ?CHOL 178  ?HDL 77*  ?[place] 86  ?TRIG 61  ? ?No results found for: HGBA1C ? ?Procedures since last visit: ?No results found. ? ?Assessment/Plan ?1. Alzheimer disease ([place]) ?On Namenda and Aricept ?I will discontinue her Seroquel in AM and PRN Klonopin  ?Continue HS Seroquel ?Will continue to monitor ? ?2. Hyperlipidemia, unspecified hyperlipidemia type ?On statin ?Would Need lipid Panel ? ?3. Depression with anxiety ?On Lexapro and Schedule Klonopin ? ?4. Vitamin B12 deficiency ?On Supplement ? ?5. Vitamin D deficiency ?On Supplement ? ?6. Osteoporosis, unspecified osteoporosis type, unspecified pathological fracture presence ?Fosamax since 2019 ?DEXA this time in 02/23 showed T score of -2.25 ?Plan to continue Fosamax till 2024 ? ?Labs/tests ordered:  * No order type specified * ? ?[name], MD  ? ? ? ?  ?

## 2021-05-26 DIAGNOSIS — R1310 Dysphagia, unspecified: Secondary | ICD-10-CM | POA: Diagnosis not present

## 2021-05-26 DIAGNOSIS — R29898 Other symptoms and signs involving the musculoskeletal system: Secondary | ICD-10-CM | POA: Diagnosis not present

## 2021-05-26 DIAGNOSIS — N3946 Mixed incontinence: Secondary | ICD-10-CM | POA: Diagnosis not present

## 2021-05-26 DIAGNOSIS — M6281 Muscle weakness (generalized): Secondary | ICD-10-CM | POA: Diagnosis not present

## 2021-05-26 DIAGNOSIS — R1312 Dysphagia, oropharyngeal phase: Secondary | ICD-10-CM | POA: Diagnosis not present

## 2021-05-27 DIAGNOSIS — M6281 Muscle weakness (generalized): Secondary | ICD-10-CM | POA: Diagnosis not present

## 2021-05-27 DIAGNOSIS — R1310 Dysphagia, unspecified: Secondary | ICD-10-CM | POA: Diagnosis not present

## 2021-05-27 DIAGNOSIS — R1312 Dysphagia, oropharyngeal phase: Secondary | ICD-10-CM | POA: Diagnosis not present

## 2021-05-27 DIAGNOSIS — N3946 Mixed incontinence: Secondary | ICD-10-CM | POA: Diagnosis not present

## 2021-05-27 DIAGNOSIS — R29898 Other symptoms and signs involving the musculoskeletal system: Secondary | ICD-10-CM | POA: Diagnosis not present

## 2021-05-28 DIAGNOSIS — R29898 Other symptoms and signs involving the musculoskeletal system: Secondary | ICD-10-CM | POA: Diagnosis not present

## 2021-05-28 DIAGNOSIS — R1312 Dysphagia, oropharyngeal phase: Secondary | ICD-10-CM | POA: Diagnosis not present

## 2021-05-28 DIAGNOSIS — M6281 Muscle weakness (generalized): Secondary | ICD-10-CM | POA: Diagnosis not present

## 2021-05-28 DIAGNOSIS — R1310 Dysphagia, unspecified: Secondary | ICD-10-CM | POA: Diagnosis not present

## 2021-05-28 DIAGNOSIS — N3946 Mixed incontinence: Secondary | ICD-10-CM | POA: Diagnosis not present

## 2021-05-29 DIAGNOSIS — R1310 Dysphagia, unspecified: Secondary | ICD-10-CM | POA: Diagnosis not present

## 2021-05-29 DIAGNOSIS — R29898 Other symptoms and signs involving the musculoskeletal system: Secondary | ICD-10-CM | POA: Diagnosis not present

## 2021-05-29 DIAGNOSIS — M6281 Muscle weakness (generalized): Secondary | ICD-10-CM | POA: Diagnosis not present

## 2021-05-29 DIAGNOSIS — R1312 Dysphagia, oropharyngeal phase: Secondary | ICD-10-CM | POA: Diagnosis not present

## 2021-05-29 DIAGNOSIS — N3946 Mixed incontinence: Secondary | ICD-10-CM | POA: Diagnosis not present

## 2021-06-02 DIAGNOSIS — N3946 Mixed incontinence: Secondary | ICD-10-CM | POA: Diagnosis not present

## 2021-06-02 DIAGNOSIS — M6281 Muscle weakness (generalized): Secondary | ICD-10-CM | POA: Diagnosis not present

## 2021-06-02 DIAGNOSIS — R1310 Dysphagia, unspecified: Secondary | ICD-10-CM | POA: Diagnosis not present

## 2021-06-02 DIAGNOSIS — R29898 Other symptoms and signs involving the musculoskeletal system: Secondary | ICD-10-CM | POA: Diagnosis not present

## 2021-06-02 DIAGNOSIS — R1312 Dysphagia, oropharyngeal phase: Secondary | ICD-10-CM | POA: Diagnosis not present

## 2021-06-04 DIAGNOSIS — R1312 Dysphagia, oropharyngeal phase: Secondary | ICD-10-CM | POA: Diagnosis not present

## 2021-06-04 DIAGNOSIS — M6281 Muscle weakness (generalized): Secondary | ICD-10-CM | POA: Diagnosis not present

## 2021-06-04 DIAGNOSIS — N3946 Mixed incontinence: Secondary | ICD-10-CM | POA: Diagnosis not present

## 2021-06-04 DIAGNOSIS — R1310 Dysphagia, unspecified: Secondary | ICD-10-CM | POA: Diagnosis not present

## 2021-06-04 DIAGNOSIS — R29898 Other symptoms and signs involving the musculoskeletal system: Secondary | ICD-10-CM | POA: Diagnosis not present

## 2021-06-05 ENCOUNTER — Non-Acute Institutional Stay (SKILLED_NURSING_FACILITY): Payer: Medicare PPO | Admitting: Orthopedic Surgery

## 2021-06-05 ENCOUNTER — Encounter: Payer: Self-pay | Admitting: Orthopedic Surgery

## 2021-06-05 DIAGNOSIS — G309 Alzheimer's disease, unspecified: Secondary | ICD-10-CM | POA: Diagnosis not present

## 2021-06-05 DIAGNOSIS — F0280 Dementia in other diseases classified elsewhere without behavioral disturbance: Secondary | ICD-10-CM | POA: Diagnosis not present

## 2021-06-05 DIAGNOSIS — R1110 Vomiting, unspecified: Secondary | ICD-10-CM | POA: Diagnosis not present

## 2021-06-05 DIAGNOSIS — K5901 Slow transit constipation: Secondary | ICD-10-CM

## 2021-06-05 NOTE — Progress Notes (Signed)
?Location:  [facility] ?Nursing Home Room Number: N110/A ?Place of Service:  SNF (31) ?Provider: Yvonna Alanis, NP ? ?Patient Care Team: ?Christine Dad, MD as PCP - General (Internal Medicine) ? ?Extended Emergency Contact Information ?Primary Emergency Contact: [name],[name] ?Address: [address]         [place], [place] [address] [place] ?Home Phone: [phone] ?Relation: Spouse ?Secondary Emergency Contact: [name],[name] ?Mobile Phone: [phone] ?Relation: Other ? ?Code Status:  DNR ?Goals of care: Advanced Directive information ? ?  06/05/2021  ?  3:31 PM  ?Advanced Directives  ?Does Patient Have a Medical Advance Directive? Yes  ?Type of Advance Directive Living will;Healthcare Power of Attorney  ?Does patient want to make changes to medical advance directive? No - Patient declined  ?Copy of [place] in Chart? Yes - validated most recent copy scanned in chart (See row information)  ? ? ? ?Chief Complaint  ?Patient presents with  ? Acute Visit  ?  Vomiting  ? ? ?HPI:  ?Pt is a 76 y.o. female seen today for an acute visit for vomiting.  ? ?She currently resides on the memory care unit due to AD. Nursing reports 3 episodes on vomiting in the past 2 weeks. Concerns she was pocketing foods. She has been evaluated by ST. Diet changed to regular, chopped foods. Tolerating thin liquids. LBM 05/11, hard stools reported by nursing staff. No apparent fevers or abdominal pain.  ? ?Alzheimers disease- no behavioral outbursts,MMSE 10/30 03/11/2021, ambulates with assistance, poor safety awareness- unable to use walker, remains on depakote, aricept, seroquel, clonazepam and namenda ? ? ?Past Medical History:  ?Diagnosis Date  ? Abnormal weight loss   ? Age-related osteoporosis without current pathological fracture   ? Allergic rhinitis   ? Atherosclerosis of aorta ([place])   ? Chronic obstructive pulmonary disease (COPD) ([place])   ? Corns and callosities   ? Depression   ?  H/O mammogram 2021  ? Per [facility] New Patient Packet  ? Hyperlipemia   ? Hyperlipidemia   ? Melanocytic nevi, unspecified   ? Mild cognitive impairment, so stated   ? Osteopenia   ? Osteoporosis   ? Other specified disorders of bone density and structure, unspecified site   ? Pain in unspecified ankle and joints of unspecified foot   ? ?Past Surgical History:  ?Procedure Laterality Date  ? COLONOSCOPY  2020  ? Per [facility] New Patient Packet  ? ? ?Allergies  ?Allergen Reactions  ? Penicillin G Rash  ?  Welts  ? ? ?Outpatient Encounter Medications as of 06/05/2021  ?Medication Sig  ? acetaminophen (TYLENOL) 325 MG tablet Take 650 mg by mouth 2 (two) times daily as needed.  ? alendronate (FOSAMAX) 70 MG tablet Take 70 mg by mouth once a week. Take with a full glass of water on an empty stomach.  ? calcium carbonate (OSCAL) 1500 (600 Ca) MG TABS tablet Take 600 mg by mouth 2 (two) times daily with a meal.  ? Cholecalciferol (VITAMIN D) 50 MCG (2000 UT) tablet Take 2,000 Units by mouth daily.  ? clonazePAM (KLONOPIN) 0.5 MG tablet Take 1 tablet (0.5 mg total) by mouth 2 (two) times daily.  ? CYANOCOBALAMIN PO Take 3,000 mcg by mouth daily.  ? divalproex (DEPAKOTE) 125 MG DR tablet Take 125 mg by mouth daily.  ? divalproex (DEPAKOTE) 125 MG DR tablet Take 250 mg by mouth 2 (two) times daily.  ? donepezil (ARICEPT) 5 MG tablet   ?  escitalopram (LEXAPRO) 10 MG tablet Take 10 mg by mouth daily.  ? memantine (NAMENDA) 10 MG tablet Take 10 mg by mouth 2 (two) times daily.  ? Multiple Vitamin (MULTIVITAMIN) capsule Take by mouth daily. 400 mcg; amt: 1Tablet  ? QUEtiapine (SEROQUEL) 25 MG tablet Take 25 mg by mouth at bedtime.  ? rosuvastatin (CRESTOR) 10 MG tablet Take 10 mg by mouth daily.  ? [DISCONTINUED] QUEtiapine (SEROQUEL) 25 MG tablet Take 12.5 mg by mouth daily.  ? ?No facility-administered encounter medications on file as of 06/05/2021.  ? ? ?Review of Systems  ?Unable to perform ROS: Dementia  ? ?Immunization History   ?Administered Date(s) Administered  ? Influenza Inj Mdck Quad With Preservative 10/11/2015  ? Influenza, High Dose Seasonal PF 11/21/2013  ? Influenza, Quadrivalent, Recombinant, Inj, Pf 10/01/2017, 10/12/2018, 10/27/2019  ? Influenza-Unspecified 11/22/2014, 11/12/2020  ? PFIZER(Purple Top)SARS-COV-2 Vaccination 02/16/2019, 03/09/2019, 10/26/2019, 06/24/2020, 10/14/2020  ? PPD Test 10/23/2005  ? Pneumococcal Conjugate-13 08/08/2013  ? Pneumococcal Polysaccharide-23 12/27/2011, 01/25/2018  ? Tdap 11/28/2009, 03/15/2021  ? Zoster, Live 03/15/2005  ? Zoster, Unspecified 09/01/2016, 12/02/2016  ? ?Pertinent  Health Maintenance Due  ?Topic Date Due  ? INFLUENZA VACCINE  08/25/2021  ? DEXA SCAN  Completed  ? ?   ? View : No data to display.  ?  ?  ?  ? ?Functional Status Survey: ?  ? ?Vitals:  ? 06/05/21 1528  ?BP: 126/74  ?Pulse: 72  ?Resp: 19  ?Temp: 98 ?F (36.7 ?C)  ?SpO2: 97%  ?Weight: 135 lb 3.2 oz (61.3 kg)  ?Height: '5\' 5"'$  (1.651 m)  ? ?Body mass index is 22.5 kg/m?Marland Kitchen ?Physical Exam ?Vitals reviewed.  ?Constitutional:   ?   General: She is not in acute distress. ?HENT:  ?   Head: Normocephalic.  ?Eyes:  ?   General:     ?   Right eye: No discharge.     ?   Left eye: No discharge.  ?Cardiovascular:  ?   Rate and Rhythm: Normal rate and regular rhythm.  ?   Pulses: Normal pulses.  ?   Heart sounds: Normal heart sounds. No murmur heard. ?Pulmonary:  ?   Effort: Pulmonary effort is normal. No respiratory distress.  ?   Breath sounds: Normal breath sounds. No decreased breath sounds or wheezing.  ?Abdominal:  ?   General: Bowel sounds are normal. There is no distension.  ?   Palpations: Abdomen is soft.  ?   Tenderness: There is no abdominal tenderness. There is no guarding.  ?Musculoskeletal:  ?   Cervical back: Neck supple.  ?   Right lower leg: No edema.  ?   Left lower leg: No edema.  ?Skin: ?   General: Skin is warm and dry.  ?   Capillary Refill: Capillary refill takes less than 2 seconds.  ?Neurological:  ?    General: No focal deficit present.  ?   Mental Status: She is alert. Mental status is at baseline.  ?   Motor: Weakness present.  ?   Gait: Gait abnormal.  ?Psychiatric:     ?   Mood and Affect: Mood normal.     ?   Behavior: Behavior normal.     ?   Cognition and Memory: Memory is impaired.  ?   Comments: Very pleasant, follows commands, alert to self  ? ? ?Labs reviewed: ?Recent Labs  ?  08/20/20 ?0000 12/03/20 ?0000 02/12/21 ?0000  ?NA 138 140 135*  ?K 4.1 4.4  4.2  ?CL 99 101 99  ?CO2 29* 24* 27*  ?BUN '14 16 13  '$ ?CREATININE 0.7 0.7 0.7  ?CALCIUM 9.4 9.9 9.2  ? ?Recent Labs  ?  08/20/20 ?0000 12/03/20 ?0000 02/12/21 ?0000  ?AST '22 23 30  '$ ?ALT '20 19 25  '$ ?ALKPHOS 57 71 66  ?ALBUMIN 3.7 4.1 3.6  ? ?Recent Labs  ?  07/16/20 ?0000 08/20/20 ?0000 12/03/20 ?0000 02/12/21 ?0000  ?WBC 7.6 7.6 7.3 6.4  ?NEUTROABS 3,762.00 3,595.00 3,811.00  --   ?HGB 15.0 13.9 14.7 13.6  ?HCT 46 41 44 40  ?PLT 388 294 276 274  ? ?Lab Results  ?Component Value Date  ? TSH 2.04 12/03/2020  ? ?No results found for: HGBA1C ?Lab Results  ?Component Value Date  ? CHOL 178 06/18/2020  ? HDL 77 (A) 06/18/2020  ? [place] 86 06/18/2020  ? TRIG 61 06/18/2020  ? ? ?Significant Diagnostic Results in last 30 days:  ?No results found. ? ?Assessment/Plan ?1. Vomiting, unspecified vomiting type, unspecified whether nausea present ?- 3 episodes of vomiting in past 2 weeks ?- exam unremarkable, afebrile ?- suspect related to dysphagia ?- working with ST- started chopped foods ?- ? portion size- will try reduced portions ?- cont chopped foods with thin liquids ? ?2. Slow transit constipation ?- LBM 05/11, described as hard stools ?- abdomen soft, normal bowel sounds ?- start senna 8.6 mg - 2 tablets po qhs ?- encourage water for hydration ? ?3. Alzheimer disease [facility]) ?- doing well in memory care ?- MMSE 10/30 03/11/2021 ?- ambulates with assistance ?- cont depakote, seroquel, aricept, clonazepam and namenda ? ? ? ?Family/ staff Communication: plan discussed  with patient and nurse ? ?Labs/tests ordered:  none ? ?

## 2021-06-07 MED ORDER — SENNA 8.6 MG PO TABS: 2.0000 | ORAL_TABLET | Freq: Every day | ORAL | 0 refills | Status: DC

## 2021-06-09 DIAGNOSIS — R29898 Other symptoms and signs involving the musculoskeletal system: Secondary | ICD-10-CM | POA: Diagnosis not present

## 2021-06-09 DIAGNOSIS — R1310 Dysphagia, unspecified: Secondary | ICD-10-CM | POA: Diagnosis not present

## 2021-06-09 DIAGNOSIS — R1312 Dysphagia, oropharyngeal phase: Secondary | ICD-10-CM | POA: Diagnosis not present

## 2021-06-09 DIAGNOSIS — M6281 Muscle weakness (generalized): Secondary | ICD-10-CM | POA: Diagnosis not present

## 2021-06-09 DIAGNOSIS — N3946 Mixed incontinence: Secondary | ICD-10-CM | POA: Diagnosis not present

## 2021-06-11 DIAGNOSIS — N3946 Mixed incontinence: Secondary | ICD-10-CM | POA: Diagnosis not present

## 2021-06-11 DIAGNOSIS — R1310 Dysphagia, unspecified: Secondary | ICD-10-CM | POA: Diagnosis not present

## 2021-06-11 DIAGNOSIS — R1312 Dysphagia, oropharyngeal phase: Secondary | ICD-10-CM | POA: Diagnosis not present

## 2021-06-11 DIAGNOSIS — R29898 Other symptoms and signs involving the musculoskeletal system: Secondary | ICD-10-CM | POA: Diagnosis not present

## 2021-06-11 DIAGNOSIS — M6281 Muscle weakness (generalized): Secondary | ICD-10-CM | POA: Diagnosis not present

## 2021-06-16 DIAGNOSIS — N3946 Mixed incontinence: Secondary | ICD-10-CM | POA: Diagnosis not present

## 2021-06-16 DIAGNOSIS — R29898 Other symptoms and signs involving the musculoskeletal system: Secondary | ICD-10-CM | POA: Diagnosis not present

## 2021-06-16 DIAGNOSIS — R1312 Dysphagia, oropharyngeal phase: Secondary | ICD-10-CM | POA: Diagnosis not present

## 2021-06-16 DIAGNOSIS — M6281 Muscle weakness (generalized): Secondary | ICD-10-CM | POA: Diagnosis not present

## 2021-06-16 DIAGNOSIS — R1310 Dysphagia, unspecified: Secondary | ICD-10-CM | POA: Diagnosis not present

## 2021-06-17 ENCOUNTER — Encounter: Payer: Self-pay | Admitting: Orthopedic Surgery

## 2021-06-17 ENCOUNTER — Non-Acute Institutional Stay (SKILLED_NURSING_FACILITY): Payer: Medicare PPO | Admitting: Orthopedic Surgery

## 2021-06-17 DIAGNOSIS — E785 Hyperlipidemia, unspecified: Secondary | ICD-10-CM

## 2021-06-17 DIAGNOSIS — F418 Other specified anxiety disorders: Secondary | ICD-10-CM

## 2021-06-17 DIAGNOSIS — R1110 Vomiting, unspecified: Secondary | ICD-10-CM

## 2021-06-17 DIAGNOSIS — R1312 Dysphagia, oropharyngeal phase: Secondary | ICD-10-CM | POA: Diagnosis not present

## 2021-06-17 DIAGNOSIS — E559 Vitamin D deficiency, unspecified: Secondary | ICD-10-CM

## 2021-06-17 DIAGNOSIS — G309 Alzheimer's disease, unspecified: Secondary | ICD-10-CM

## 2021-06-17 DIAGNOSIS — M810 Age-related osteoporosis without current pathological fracture: Secondary | ICD-10-CM

## 2021-06-17 DIAGNOSIS — E538 Deficiency of other specified B group vitamins: Secondary | ICD-10-CM

## 2021-06-17 DIAGNOSIS — F0280 Dementia in other diseases classified elsewhere without behavioral disturbance: Secondary | ICD-10-CM

## 2021-06-17 DIAGNOSIS — K5901 Slow transit constipation: Secondary | ICD-10-CM | POA: Diagnosis not present

## 2021-06-17 NOTE — Progress Notes (Signed)
Location:  [place] Room Number: N110/A Place of Service:  SNF [phone]) Provider: Yvonna Alanis, NP   Patient Care Team: [name], MD as PCP - General (Internal Medicine)  Extended Emergency Contact Information Primary Emergency Contact: Colt,[name] Address: [address]          [place], [place] [address] [place] Phone: [phone] Relation: Spouse Secondary Emergency Contact: Niehaus,[name] Mobile Phone: [phone] Relation: Other  Code Status:  DNR Goals of care: Advanced Directive information    06/17/2021    1:38 PM  Advanced Directives  Does Patient Have a Medical Advance Directive? Yes  Type of Advance Directive Living will;Healthcare Power of Attorney  Does patient want to make changes to medical advance directive? No - Patient declined  Copy of [place] in Chart? Yes - validated most recent copy scanned in chart (See row information)     Chief Complaint  Patient presents with   Medical Management of Chronic Issues    Routine visit.   Quality Metric Gaps    Discuss the need for Hep C screening and Shingrix vaccine, or post pone if patient refuses.     HPI:  Pt is a 76 y.o. female seen today for medical management of chronic diseases.     She currently resides on the memory care unit at [facility] due to AD. PMH: dysphagia, osteoporosis, HLD, vitamin B12 deficiency, depression and anxiety.   AD- MMSE 10/30 03/11/2021, resides in memory care, unable to ambulate without assistance, poor safety awareness, no behavioral outbursts, remains on Depakote, Aricept and Namenda Dysphagia- vomiting episodes in past month (3 episodes), portion sizes reduced, remains on chopped foods and thin liquids Depression and anxiety- no mood changes, remains on Lexapro and klonopin HLD- LDL 86 06/18/2021, remains on statin Vitamin B12 deficiency- Vitamin B12 551 06/18/2020, remains on  cyanocobalamin Vitamin D deficiency- vitamin D level 31 06/18/2020, remains on 2000 units/daily Osteoporosis- DEXA 03/11/2021, t score -2.256, remains on fosamax Constipation- LBM 05/24, remains on senna and miralax prn  No recent falls or injuries.   Recent blood pressures:  05/23- 126/74  05/16- 128/70  05/09- 126/74  Recent weights:  05/03- 135.2 lbs  04/02- 138.4 lbs  03/01- 135.8 lbs    Past Medical History:  Diagnosis Date   Abnormal weight loss    Age-related osteoporosis without current pathological fracture    Allergic rhinitis    Atherosclerosis of aorta (HCC)    Chronic obstructive pulmonary disease (COPD) (HCC)    Corns and callosities    Depression    H/O mammogram 2021   Per [facility] New Patient Packet   Hyperlipemia    Hyperlipidemia    Melanocytic nevi, unspecified    Mild cognitive impairment, so stated    Osteopenia    Osteoporosis    Other specified disorders of bone density and structure, unspecified site    Pain in unspecified ankle and joints of unspecified foot    Past Surgical History:  Procedure Laterality Date   COLONOSCOPY  2020   Per [facility] New Patient Packet    Allergies  Allergen Reactions   Penicillin G Rash    Welts    Outpatient Encounter Medications as of 06/17/2021  Medication Sig   acetaminophen (TYLENOL) 325 MG tablet Take 650 mg by mouth 2 (two) times daily as needed.   alendronate (FOSAMAX) 70 MG tablet Take 70 mg by mouth once a week. Take with a full  glass of water on an empty stomach.   calcium carbonate (OSCAL) 1500 (600 Ca) MG TABS tablet Take 600 mg by mouth 2 (two) times daily with a meal.   Cholecalciferol (VITAMIN D) 50 MCG (2000 UT) tablet Take 2,000 Units by mouth daily.   clonazePAM (KLONOPIN) 0.5 MG tablet Take 1 tablet (0.5 mg total) by mouth 2 (two) times daily.   CYANOCOBALAMIN PO Take 3,000 mcg by mouth daily.   divalproex (DEPAKOTE) 125 MG DR tablet Take 125 mg by mouth daily.   divalproex (DEPAKOTE) 125 MG DR  tablet Take 250 mg by mouth 2 (two) times daily.   donepezil (ARICEPT) 5 MG tablet Take 5 mg by mouth at bedtime.   escitalopram (LEXAPRO) 10 MG tablet Take 10 mg by mouth daily.   memantine (NAMENDA) 10 MG tablet Take 10 mg by mouth 2 (two) times daily.   Multiple Vitamin (MULTIVITAMIN) capsule Take by mouth daily. 400 mcg; amt: 1Tablet   polyethylene glycol (MIRALAX / GLYCOLAX) 17 g packet Take 17 g by mouth daily as needed for mild constipation.   QUEtiapine (SEROQUEL) 25 MG tablet Take 25 mg by mouth at bedtime.   rosuvastatin (CRESTOR) 10 MG tablet Take 10 mg by mouth daily.   senna (SENNA LAX) 8.6 MG tablet Take 2 tablets by mouth daily.   [DISCONTINUED] senna (SENOKOT) 8.6 MG TABS tablet Take 2 tablets (17.2 mg total) by mouth at bedtime.   No facility-administered encounter medications on file as of 06/17/2021.    Review of Systems  Unable to perform ROS: Dementia   Immunization History  Administered Date(s) Administered   Influenza Inj Mdck Quad With Preservative 10/11/2015   Influenza, High Dose Seasonal PF 11/21/2013   Influenza, Quadrivalent, Recombinant, Inj, Pf 10/01/2017, 10/12/2018, 10/27/2019   Influenza-Unspecified 11/22/2014, 11/12/2020   PFIZER(Purple Top)SARS-COV-2 Vaccination 02/16/2019, 03/09/2019, 10/26/2019, 06/24/2020, 10/14/2020   PPD Test 10/23/2005   Pneumococcal Conjugate-13 08/08/2013   Pneumococcal Polysaccharide-23 12/27/2011, 01/25/2018   Tdap 11/28/2009, 03/15/2021   Zoster, Live 03/15/2005   Zoster, Unspecified 09/01/2016, 12/02/2016   Pertinent  Health Maintenance Due  Topic Date Due   INFLUENZA VACCINE  08/25/2021   DEXA SCAN  Completed       View : No data to display.         Functional Status Survey:    Vitals:   06/17/21 1331  BP: 126/74  Pulse: 64  Resp: 19  Temp: 97.8 F (36.6 C)  SpO2: 98%  Weight: 135 lb 3.2 oz (61.3 kg)  Height: '5\' 5"'$  (1.651 m)   Body mass index is 22.5 kg/m. Physical Exam Vitals reviewed.   Constitutional:      General: She is not in acute distress. HENT:     Head: Normocephalic.     Right Ear: There is no impacted cerumen.     Left Ear: There is no impacted cerumen.     Nose: Nose normal.     Mouth/Throat:     Mouth: Mucous membranes are moist.  Eyes:     General:        Right eye: No discharge.        Left eye: No discharge.  Cardiovascular:     Rate and Rhythm: Normal rate and regular rhythm.     Pulses: Normal pulses.     Heart sounds: Normal heart sounds.  Pulmonary:     Effort: Pulmonary effort is normal.     Breath sounds: Normal breath sounds.  Abdominal:     General: Bowel  sounds are normal. There is no distension.     Palpations: Abdomen is soft.     Tenderness: There is no abdominal tenderness.  Musculoskeletal:     Cervical back: Neck supple.     Right lower leg: No edema.     Left lower leg: No edema.  Skin:    General: Skin is warm and dry.     Capillary Refill: Capillary refill takes less than 2 seconds.  Neurological:     General: No focal deficit present.     Mental Status: She is alert. Mental status is at baseline.     Motor: Weakness present.     Gait: Gait abnormal.     Comments: wheelchair  Psychiatric:        Mood and Affect: Mood normal.        Behavior: Behavior normal.        Cognition and Memory: Cognition is impaired. Memory is impaired.     Comments: Very pleasant, aphasia, follows some commands, alert to self    Labs reviewed: Recent Labs    08/20/20 0000 12/03/20 0000 02/12/21 0000  NA 138 140 135*  K 4.1 4.4 4.2  CL 99 101 99  CO2 29* 24* 27*  BUN '14 16 13  '$ CREATININE 0.7 0.7 0.7  CALCIUM 9.4 9.9 9.2   Recent Labs    08/20/20 0000 12/03/20 0000 02/12/21 0000  AST '22 23 30  '$ ALT '20 19 25  '$ ALKPHOS 57 71 66  ALBUMIN 3.7 4.1 3.6   Recent Labs    07/16/20 0000 08/20/20 0000 12/03/20 0000 02/12/21 0000  WBC 7.6 7.6 7.3 6.4  NEUTROABS 3,762.00 3,595.00 3,811.00  --   HGB 15.0 13.9 14.7 13.6  HCT 46 41  44 40  PLT 388 294 276 274   Lab Results  Component Value Date   TSH 2.04 12/03/2020   No results found for: HGBA1C Lab Results  Component Value Date   CHOL 178 06/18/2020   HDL 77 (A) 06/18/2020   LDLCALC 86 06/18/2020   TRIG 61 06/18/2020    Significant Diagnostic Results in last 30 days:  No results found.  Assessment/Plan 1. Alzheimer disease ([place]) - suspect progression in disease - does not ambulate on own - unable to hold on to walker - aphasia - no behavioral outbursts - cont Depakote, Aricept and Namenda - cont memory care  - husband not interested in hospice at this time  2. Oropharyngeal dysphagia - vomiting earlier this month (3 episodes) - portion sized reduced - cont mechanical soft diet and thin liquids  3. Vomiting, unspecified vomiting type, unspecified whether nausea present - no recent episodes - see above  4. Depression with anxiety - cont Lexapro and Klonopin  5. Hyperlipidemia, unspecified hyperlipidemia type - LDL stable - cont statin  6. Vitamin B12 deficiency - cont cyanocobalamin  7. Vitamin D deficiency - cont vitamin D supplement  8. Osteoporosis, unspecified osteoporosis type, unspecified pathological fracture presence - t score -2.256 03/11/2021 - cont fosamax and vitamin D  9. Slow transit constipation - LBM 05/24 - cont senna and miralax    Family/ staff Communication: plan discussed with nurse  Labs/tests ordered: none

## 2021-06-18 ENCOUNTER — Non-Acute Institutional Stay (SKILLED_NURSING_FACILITY): Payer: Medicare PPO | Admitting: Internal Medicine

## 2021-06-18 ENCOUNTER — Encounter: Payer: Self-pay | Admitting: Internal Medicine

## 2021-06-18 DIAGNOSIS — M810 Age-related osteoporosis without current pathological fracture: Secondary | ICD-10-CM

## 2021-06-18 DIAGNOSIS — F418 Other specified anxiety disorders: Secondary | ICD-10-CM

## 2021-06-18 DIAGNOSIS — R29898 Other symptoms and signs involving the musculoskeletal system: Secondary | ICD-10-CM | POA: Diagnosis not present

## 2021-06-18 DIAGNOSIS — F0280 Dementia in other diseases classified elsewhere without behavioral disturbance: Secondary | ICD-10-CM

## 2021-06-18 DIAGNOSIS — Z7189 Other specified counseling: Secondary | ICD-10-CM

## 2021-06-18 DIAGNOSIS — R1312 Dysphagia, oropharyngeal phase: Secondary | ICD-10-CM | POA: Diagnosis not present

## 2021-06-18 DIAGNOSIS — M6281 Muscle weakness (generalized): Secondary | ICD-10-CM | POA: Diagnosis not present

## 2021-06-18 DIAGNOSIS — N3946 Mixed incontinence: Secondary | ICD-10-CM | POA: Diagnosis not present

## 2021-06-18 DIAGNOSIS — R1310 Dysphagia, unspecified: Secondary | ICD-10-CM | POA: Diagnosis not present

## 2021-06-18 DIAGNOSIS — E785 Hyperlipidemia, unspecified: Secondary | ICD-10-CM | POA: Diagnosis not present

## 2021-06-18 DIAGNOSIS — G309 Alzheimer's disease, unspecified: Secondary | ICD-10-CM

## 2021-06-18 NOTE — Progress Notes (Signed)
Location:   [place] Room Number: 110 Place of Service:  SNF [phone]) Provider:  Veleta Miners MD  [name], MD  Patient Care Team: [name], MD as PCP - General (Internal Medicine)  Extended Emergency Contact Information Primary Emergency Contact: Coulston,[name] Address: [address]          [place], [place] [address] [name] of [place] Phone: [phone] Relation: Spouse Secondary Emergency Contact: Woodyard,[name] Mobile Phone: [phone] Relation: Other  Code Status:  DNR Managed Care Goals of care: Advanced Directive information    06/18/2021    1:54 PM  Advanced Directives  Does Patient Have a Medical Advance Directive? Yes  Type of Advance Directive Living will;Healthcare Power of Attorney  Does patient want to make changes to medical advance directive? No - Patient declined  Copy of [place] in Chart? Yes - validated most recent copy scanned in chart (See row information)     Chief Complaint  Patient presents with   Acute Visit    Family meeting    HPI:  Pt is a 76 y.o. female seen today for an acute visit for Meeting with her husband for Goals of care and Worsening dementia   Lives in Memory unit Patient has a history of Alzheimer's disease,, osteoporosis, vitamin D and B12 deficiency, hyperlipidemia, anxiety Before the admission patient Eloped her apartment and was found by the facility staff.  Her husband decided to move her to memory unit  Patient has had progression of her dementia She is now not able to walk with her walker.   only in her wheelchair.   Needs help with feeding  needs help with transfers. I had reduced her Seroquel but the husband states that since then patient seems more anxious Per nurses note they were holding her Klonopin in the morning because she is more drowsy. Weight seems to be stable On chopped food with thin liquids Her last MMSE was 10 out of  30 Patient does have aphasia.  Did not have any acute complaints.  Wt Readings from Last 3 Encounters:  06/18/21 135 lb 3.2 oz (61.3 kg)  06/17/21 135 lb 3.2 oz (61.3 kg)  06/05/21 135 lb 3.2 oz (61.3 kg)    Past Medical History:  Diagnosis Date   Abnormal weight loss    Age-related osteoporosis without current pathological fracture    Allergic rhinitis    Atherosclerosis of aorta (HCC)    Chronic obstructive pulmonary disease (COPD) (HCC)    Corns and callosities    Depression    H/O mammogram 2021   Per [facility] New Patient Packet   Hyperlipemia    Hyperlipidemia    Melanocytic nevi, unspecified    Mild cognitive impairment, so stated    Osteopenia    Osteoporosis    Other specified disorders of bone density and structure, unspecified site    Pain in unspecified ankle and joints of unspecified foot    Past Surgical History:  Procedure Laterality Date   COLONOSCOPY  2020   Per [facility] New Patient Packet    Allergies  Allergen Reactions   Penicillin G Rash    Welts    Allergies as of 06/18/2021       Reactions   Penicillin G Rash   Welts        Medication List        Accurate as of Jun 18, 2021  1:54 PM. If you have any questions, ask your  nurse or doctor.          acetaminophen 325 MG tablet Commonly known as: TYLENOL Take 650 mg by mouth 2 (two) times daily as needed.   alendronate 70 MG tablet Commonly known as: FOSAMAX Take 70 mg by mouth once a week. Take with a full glass of water on an empty stomach.   calcium carbonate 1500 (600 Ca) MG Tabs tablet Commonly known as: OSCAL Take 600 mg by mouth 2 (two) times daily with a meal.   clonazePAM 0.5 MG tablet Commonly known as: KLONOPIN Take 1 tablet (0.5 mg total) by mouth 2 (two) times daily.   CYANOCOBALAMIN PO Take 3,000 mcg by mouth daily.   divalproex 125 MG DR tablet Commonly known as: DEPAKOTE Take 125 mg by mouth daily.   divalproex 125 MG DR tablet Commonly known as: DEPAKOTE Take  250 mg by mouth 2 (two) times daily.   donepezil 5 MG tablet Commonly known as: ARICEPT Take 5 mg by mouth at bedtime.   escitalopram 10 MG tablet Commonly known as: LEXAPRO Take 10 mg by mouth daily.   memantine 10 MG tablet Commonly known as: NAMENDA Take 10 mg by mouth 2 (two) times daily.   multivitamin capsule Take by mouth daily. 400 mcg; amt: 1Tablet   polyethylene glycol 17 g packet Commonly known as: MIRALAX / GLYCOLAX Take 17 g by mouth daily as needed for mild constipation.   QUEtiapine 25 MG tablet Commonly known as: SEROQUEL Take 25 mg by mouth at bedtime.   rosuvastatin 10 MG tablet Commonly known as: CRESTOR Take 10 mg by mouth daily.   senna 8.6 MG tablet Commonly known as: SENOKOT Take 2 tablets by mouth daily.   Vitamin D 50 MCG (2000 UT) tablet Take 2,000 Units by mouth daily.        Review of Systems  Unable to perform ROS: Dementia   Immunization History  Administered Date(s) Administered   Influenza Inj Mdck Quad With Preservative 10/11/2015   Influenza, High Dose Seasonal PF 11/21/2013   Influenza, Quadrivalent, Recombinant, Inj, Pf 10/01/2017, 10/12/2018, 10/27/2019   Influenza-Unspecified 11/22/2014, 11/12/2020   PFIZER(Purple Top)SARS-COV-2 Vaccination 02/16/2019, 03/09/2019, 10/26/2019, 06/24/2020, 10/14/2020   PPD Test 10/23/2005   Pneumococcal Conjugate-13 08/08/2013   Pneumococcal Polysaccharide-23 12/27/2011, 01/25/2018   Tdap 11/28/2009, 03/15/2021   Zoster, Live 03/15/2005   Zoster, Unspecified 09/01/2016, 12/02/2016   Pertinent  Health Maintenance Due  Topic Date Due   INFLUENZA VACCINE  08/25/2021   DEXA SCAN  Completed       View : No data to display.         Functional Status Survey:    Vitals:   06/18/21 1340  BP: 126/74  Pulse: 64  Resp: 19  Temp: 98.9 F (37.2 C)  SpO2: 98%  Weight: 135 lb 3.2 oz (61.3 kg)  Height: '5\' 5"'$  (1.651 m)   Body mass index is 22.5 kg/m. Physical Exam Vitals reviewed.   Constitutional:      Appearance: Normal appearance.  HENT:     Head: Normocephalic.     Nose: Nose normal.     Mouth/Throat:     Mouth: Mucous membranes are moist.     Pharynx: Oropharynx is clear.  Eyes:     Pupils: Pupils are equal, round, and reactive to light.  Cardiovascular:     Rate and Rhythm: Normal rate and regular rhythm.     Pulses: Normal pulses.     Heart sounds: Normal heart sounds. No murmur heard.  Pulmonary:     Effort: Pulmonary effort is normal.     Breath sounds: Normal breath sounds.  Abdominal:     General: Abdomen is flat. Bowel sounds are normal.     Palpations: Abdomen is soft.  Musculoskeletal:        General: No swelling.     Cervical back: Neck supple.  Skin:    General: Skin is warm.  Neurological:     General: No focal deficit present.     Mental Status: She is alert.  Psychiatric:        Mood and Affect: Mood normal.        Thought Content: Thought content normal.    Labs reviewed: Recent Labs    08/20/20 0000 12/03/20 0000 02/12/21 0000  NA 138 140 135*  K 4.1 4.4 4.2  CL 99 101 99  CO2 29* 24* 27*  BUN '14 16 13  '$ CREATININE 0.7 0.7 0.7  CALCIUM 9.4 9.9 9.2   Recent Labs    08/20/20 0000 12/03/20 0000 02/12/21 0000  AST '22 23 30  '$ ALT '20 19 25  '$ ALKPHOS 57 71 66  ALBUMIN 3.7 4.1 3.6   Recent Labs    07/16/20 0000 08/20/20 0000 12/03/20 0000 02/12/21 0000  WBC 7.6 7.6 7.3 6.4  NEUTROABS 3,762.00 3,595.00 3,811.00  --   HGB 15.0 13.9 14.7 13.6  HCT 46 41 44 40  PLT 388 294 276 274   Lab Results  Component Value Date   TSH 2.04 12/03/2020   No results found for: HGBA1C Lab Results  Component Value Date   CHOL 178 06/18/2020   HDL 77 (A) 06/18/2020   LDLCALC 86 06/18/2020   TRIG 61 06/18/2020    Significant Diagnostic Results in last 30 days:  No results found.  Assessment/Plan 1. 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 Hyperlipidemia, unspecified hyperlipidemia type On statin   4. Osteoporosis, unspecified osteoporosis type, unspecified pathological fracture presence Fosamax  5. Goals of care, counseling/discussion Husband aware of Worsening Cognition He wants her to be Comfort care Will consider hospice if further deterioration     Family/ staff Communication: husband  Labs/tests ordered:    Total time spent in this patient care encounter was  _45  minutes; greater than 50% of the visit spent counseling patient and staff, reviewing records , Labs and coordinating care for problems addressed at this encounter.

## 2021-06-19 ENCOUNTER — Encounter: Payer: Self-pay | Admitting: Internal Medicine

## 2021-06-22 DIAGNOSIS — F411 Generalized anxiety disorder: Secondary | ICD-10-CM | POA: Diagnosis not present

## 2021-06-22 DIAGNOSIS — F02C11 Dementia in other diseases classified elsewhere, severe, with agitation: Secondary | ICD-10-CM | POA: Diagnosis not present

## 2021-06-23 DIAGNOSIS — E785 Hyperlipidemia, unspecified: Secondary | ICD-10-CM | POA: Diagnosis not present

## 2021-06-23 DIAGNOSIS — I10 Essential (primary) hypertension: Secondary | ICD-10-CM | POA: Diagnosis not present

## 2021-07-10 ENCOUNTER — Non-Acute Institutional Stay (SKILLED_NURSING_FACILITY): Payer: Medicare PPO | Admitting: Adult Health

## 2021-07-10 ENCOUNTER — Encounter: Payer: Self-pay | Admitting: Adult Health

## 2021-07-10 DIAGNOSIS — G309 Alzheimer's disease, unspecified: Secondary | ICD-10-CM | POA: Diagnosis not present

## 2021-07-10 DIAGNOSIS — F418 Other specified anxiety disorders: Secondary | ICD-10-CM

## 2021-07-10 DIAGNOSIS — E785 Hyperlipidemia, unspecified: Secondary | ICD-10-CM | POA: Diagnosis not present

## 2021-07-10 DIAGNOSIS — F0280 Dementia in other diseases classified elsewhere without behavioral disturbance: Secondary | ICD-10-CM | POA: Diagnosis not present

## 2021-07-10 DIAGNOSIS — E538 Deficiency of other specified B group vitamins: Secondary | ICD-10-CM | POA: Diagnosis not present

## 2021-07-10 NOTE — Progress Notes (Unsigned)
Location:  [place] Room Number: NO/110A Place of Service:  SNF (31) Provider:  Durenda Age, DNP, FNP-BC  Patient Care Team: [name], MD as PCP - General (Internal Medicine)  Extended Emergency Contact Information Primary Emergency Contact: Xiong,[name] Address: [address]          [place], [place] [address] [name] of [place] Phone: [phone] Relation: Spouse Secondary Emergency Contact: Fuerte,[name] Mobile Phone: [phone] Relation: Other  Code Status:  DNR  Goals of care: Advanced Directive information    07/10/2021    1:16 PM  Advanced Directives  Does Patient Have a Medical Advance Directive? Yes  Type of Advance Directive Living will;Healthcare Power of Attorney  Does patient want to make changes to medical advance directive? No - Patient declined  Copy of [place] in Chart? Yes - validated most recent copy scanned in chart (See row information)     Chief Complaint  Patient presents with   Medical Management of Chronic Issues    Patient is here for a follow up for chronic conditions     HPI:  Pt is a 76 y.o. female seen today for medical management of chronic diseases.  ***   Past Medical History:  Diagnosis Date   Abnormal weight loss    Age-related osteoporosis without current pathological fracture    Allergic rhinitis    Atherosclerosis of aorta (HCC)    Chronic obstructive pulmonary disease (COPD) (HCC)    Corns and callosities    Depression    H/O mammogram 2021   Per [facility] New Patient Packet   Hyperlipemia    Hyperlipidemia    Melanocytic nevi, unspecified    Mild cognitive impairment, so stated    Osteopenia    Osteoporosis    Other specified disorders of bone density and structure, unspecified site    Pain in unspecified ankle and joints of unspecified foot    Past Surgical History:  Procedure Laterality Date   COLONOSCOPY  2020   Per [facility] New Patient  Packet    Allergies  Allergen Reactions   Penicillin G Rash    Welts    Outpatient Encounter Medications as of 07/10/2021  Medication Sig   acetaminophen (TYLENOL) 325 MG tablet Take 650 mg by mouth 2 (two) times daily as needed.   alendronate (FOSAMAX) 70 MG tablet Take 70 mg by mouth once a week. Take with a full glass of water on an empty stomach.   calcium carbonate (OSCAL) 1500 (600 Ca) MG TABS tablet Take 600 mg by mouth 2 (two) times daily with a meal.   Cholecalciferol (VITAMIN D) 50 MCG (2000 UT) tablet Take 2,000 Units by mouth daily.   clonazePAM (KLONOPIN) 0.5 MG tablet Take 1 tablet (0.5 mg total) by mouth 2 (two) times daily. (Patient taking differently: Take 0.5 mg by mouth at bedtime.)   CYANOCOBALAMIN PO Take 3,000 mcg by mouth daily.   divalproex (DEPAKOTE) 125 MG DR tablet Take 125 mg by mouth daily.   divalproex (DEPAKOTE) 125 MG DR tablet Take 250 mg by mouth 2 (two) times daily.   donepezil (ARICEPT) 5 MG tablet Take 5 mg by mouth at bedtime.   escitalopram (LEXAPRO) 10 MG tablet Take 10 mg by mouth daily.   memantine (NAMENDA) 10 MG tablet Take 10 mg by mouth 2 (two) times daily.   Multiple Vitamin (MULTIVITAMIN) capsule Take by mouth daily. 400 mcg; amt: 1Tablet   polyethylene glycol (MIRALAX / GLYCOLAX)  17 g packet Take 17 g by mouth daily as needed for mild constipation.   QUEtiapine (SEROQUEL) 12.5 mg TABS tablet Take 12.5 mg by mouth every morning.   QUEtiapine (SEROQUEL) 25 MG tablet Take 25 mg by mouth at bedtime.   rosuvastatin (CRESTOR) 10 MG tablet Take 10 mg by mouth daily.   senna (SENOKOT) 8.6 MG tablet Take 2 tablets by mouth daily.   No facility-administered encounter medications on file as of 07/10/2021.    Review of Systems ***    Immunization History  Administered Date(s) Administered   Influenza Inj Mdck Quad With Preservative 10/11/2015   Influenza, High Dose Seasonal PF 11/21/2013   Influenza, Quadrivalent, Recombinant, Inj, Pf  10/01/2017, 10/12/2018, 10/27/2019   Influenza-Unspecified 11/22/2014, 11/12/2020   PFIZER(Purple Top)SARS-COV-2 Vaccination 02/16/2019, 03/09/2019, 10/26/2019, 06/24/2020, 10/14/2020   PPD Test 10/23/2005   Pneumococcal Conjugate-13 08/08/2013   Pneumococcal Polysaccharide-23 12/27/2011, 01/25/2018   Tdap 11/28/2009, 03/15/2021   Zoster, Live 03/15/2005   Zoster, Unspecified 09/01/2016, 12/02/2016   Pertinent  Health Maintenance Due  Topic Date Due   INFLUENZA VACCINE  08/25/2021   DEXA SCAN  Completed       No data to display           Vitals:   07/10/21 1317  BP: 126/80  Pulse: 68  Resp: 18  Temp: (!) 96.5 F (35.8 C)  SpO2: 94%  Weight: 131 lb 12.8 oz (59.8 kg)  Height: '5\' 5"'$  (1.651 m)   Body mass index is 21.93 kg/m.  Physical Exam     Labs reviewed: Recent Labs    08/20/20 0000 12/03/20 0000 02/12/21 0000  NA 138 140 135*  K 4.1 4.4 4.2  CL 99 101 99  CO2 29* 24* 27*  BUN '14 16 13  '$ CREATININE 0.7 0.7 0.7  CALCIUM 9.4 9.9 9.2   Recent Labs    08/20/20 0000 12/03/20 0000 02/12/21 0000  AST '22 23 30  '$ ALT '20 19 25  '$ ALKPHOS 57 71 66  ALBUMIN 3.7 4.1 3.6   Recent Labs    07/16/20 0000 08/20/20 0000 12/03/20 0000 02/12/21 0000  WBC 7.6 7.6 7.3 6.4  NEUTROABS 3,762.00 3,595.00 3,811.00  --   HGB 15.0 13.9 14.7 13.6  HCT 46 41 44 40  PLT 388 294 276 274   Lab Results  Component Value Date   TSH 2.04 12/03/2020   No results found for: "HGBA1C" Lab Results  Component Value Date   CHOL 178 06/18/2020   HDL 77 (A) 06/18/2020   LDLCALC 86 06/18/2020   TRIG 61 06/18/2020    Significant Diagnostic Results in last 30 days:  No results found.  Assessment/Plan ***   Family/ staff Communication: Discussed plan of care with resident and charge nurse  Labs/tests ordered:     Durenda Age, DNP, MSN, FNP-BC [facility] [phone] (Monday-Friday 8:00 a.m. - 5:00 p.m.) [phone] (after  hours)

## 2021-07-14 DIAGNOSIS — F02C11 Dementia in other diseases classified elsewhere, severe, with agitation: Secondary | ICD-10-CM | POA: Diagnosis not present

## 2021-07-14 DIAGNOSIS — F411 Generalized anxiety disorder: Secondary | ICD-10-CM | POA: Diagnosis not present

## 2021-07-15 ENCOUNTER — Other Ambulatory Visit: Payer: Self-pay | Admitting: Adult Health

## 2021-07-15 DIAGNOSIS — G309 Alzheimer's disease, unspecified: Secondary | ICD-10-CM

## 2021-07-15 MED ORDER — CLONAZEPAM 0.5 MG PO TABS: 0.5000 mg | ORAL_TABLET | Freq: Every day | ORAL | 0 refills | Status: AC | PRN

## 2021-07-15 MED ORDER — CLONAZEPAM 0.5 MG PO TABS: 0.5000 mg | ORAL_TABLET | Freq: Every evening | ORAL | 0 refills | Status: AC

## 2021-08-05 ENCOUNTER — Non-Acute Institutional Stay (SKILLED_NURSING_FACILITY): Payer: Medicare PPO | Admitting: Adult Health

## 2021-08-05 ENCOUNTER — Encounter: Payer: Self-pay | Admitting: Adult Health

## 2021-08-05 DIAGNOSIS — E538 Deficiency of other specified B group vitamins: Secondary | ICD-10-CM | POA: Diagnosis not present

## 2021-08-05 DIAGNOSIS — M810 Age-related osteoporosis without current pathological fracture: Secondary | ICD-10-CM

## 2021-08-05 DIAGNOSIS — F418 Other specified anxiety disorders: Secondary | ICD-10-CM | POA: Diagnosis not present

## 2021-08-05 DIAGNOSIS — F02818 Dementia in other diseases classified elsewhere, unspecified severity, with other behavioral disturbance: Secondary | ICD-10-CM | POA: Diagnosis not present

## 2021-08-05 DIAGNOSIS — G309 Alzheimer's disease, unspecified: Secondary | ICD-10-CM | POA: Diagnosis not present

## 2021-08-05 DIAGNOSIS — E785 Hyperlipidemia, unspecified: Secondary | ICD-10-CM | POA: Diagnosis not present

## 2021-08-05 NOTE — Progress Notes (Signed)
Location:  [facility] Room Number: NO/110/A Place of Service:  SNF (31) Provider:  Durenda Age, DNP, FNP-BC  Patient Care Team: Christine Dad, MD as PCP - General (Internal Medicine)  Extended Emergency Contact Information Primary Emergency Contact: Christine House Address: [address]          [place], [place] [address] Christine House of Christine House Phone: [phone] Relation: Spouse Secondary Emergency Contact: Tabak,[name] Mobile Phone: [phone] Relation: Other  Code Status:  DNR  Goals of care: Advanced Directive information    08/05/2021   12:04 PM  Advanced Directives  Does Patient Have a Medical Advance Directive? Yes  Type of Paramedic of [place];Out of facility DNR (pink MOST or yellow form)  Does patient want to make changes to medical advance directive? No - Patient declined  Copy of [place] in Chart? Yes - validated most recent copy scanned in chart (See row information)  Pre-existing out of facility DNR order (yellow form or pink MOST form) Pink MOST form placed in chart (order not valid for inpatient use);Yellow form placed in chart (order not valid for inpatient use)     Chief Complaint  Patient presents with   Medical Management of Chronic Issues    Patient is here for a follow up for chronic conditions, hep c screening needed, Covid booster, and discuss Routine Visit      HPI:  Pt is a 76 y.o. female seen today for medical management of chronic diseases. She is a long-term care resident of [facility].   Osteoporosis, unspecified osteoporosis type, unspecified pathological fracture presence -  no recent fractures, takes alendronate 70 mg daily on Wednesdays  Hyperlipidemia, unspecified hyperlipidemia type -   takes rosuvastatin 10 mg daily  Vitamin B12 deficiency  -   takes vitamin B12 3000 mcg PO daily  Depression with anxiety   -   PHQ-9 score  0, takes Klonopin 0.5 mg daily as needed and 0.5 mg in the afternoon, escitalopram 10 mg daily and Seroquel 12.5 mg daily  Alzheimer's disease with behavioral disturbance (HCC)  -   BIMS score 0/10, takes Aricept 5 mg at bedtime and Namenda 10 mg twice a day, at memory care unit   Past Medical History:  Diagnosis Date   Abnormal weight loss    Age-related osteoporosis without current pathological fracture    Allergic rhinitis    Atherosclerosis of aorta (HCC)    Chronic obstructive pulmonary disease (COPD) (HCC)    Corns and callosities    Depression    H/O mammogram 2021   Per [facility] New Patient Packet   Hyperlipemia    Hyperlipidemia    Melanocytic nevi, unspecified    Mild cognitive impairment, so stated    Osteopenia    Osteoporosis    Other specified disorders of bone density and structure, unspecified site    Pain in unspecified ankle and joints of unspecified foot    Past Surgical History:  Procedure Laterality Date   COLONOSCOPY  2020   Per [facility] New Patient Packet    Allergies  Allergen Reactions   Penicillin G Rash    Welts    Outpatient Encounter Medications as of 08/05/2021  Medication Sig   acetaminophen (TYLENOL) 325 MG tablet Take 650 mg by mouth 2 (two) times daily as needed.   alendronate (FOSAMAX) 70 MG tablet Take 70 mg by mouth once a week. Take with a full glass of water on  an empty stomach.   calcium carbonate (OSCAL) 1500 (600 Ca) MG TABS tablet Take 600 mg by mouth 2 (two) times daily with a meal.   Cholecalciferol (VITAMIN D) 50 MCG (2000 UT) tablet Take 2,000 Units by mouth daily.   clonazePAM (KLONOPIN) 0.5 MG tablet Take 1 tablet (0.5 mg total) by mouth every evening.   clonazePAM (KLONOPIN) 0.5 MG tablet Take 1 tablet (0.5 mg total) by mouth daily as needed for anxiety.   CYANOCOBALAMIN PO Take 3,000 mcg by mouth daily.   divalproex (DEPAKOTE) 125 MG DR tablet Take 125 mg by mouth daily.   divalproex (DEPAKOTE) 125 MG DR tablet Take 250 mg by  mouth 2 (two) times daily.   donepezil (ARICEPT) 5 MG tablet Take 5 mg by mouth at bedtime.   escitalopram (LEXAPRO) 10 MG tablet Take 10 mg by mouth daily.   memantine (NAMENDA) 10 MG tablet Take 10 mg by mouth 2 (two) times daily.   Multiple Vitamin (MULTIVITAMIN) capsule Take by mouth daily. 400 mcg; amt: 1Tablet   polyethylene glycol (MIRALAX / GLYCOLAX) 17 g packet Take 17 g by mouth daily as needed for mild constipation.   QUEtiapine (SEROQUEL) 12.5 mg TABS tablet Take 12.5 mg by mouth every morning.   QUEtiapine (SEROQUEL) 25 MG tablet Take 25 mg by mouth at bedtime.   rosuvastatin (CRESTOR) 10 MG tablet Take 10 mg by mouth daily.   senna (SENOKOT) 8.6 MG tablet Take 2 tablets by mouth daily.   No facility-administered encounter medications on file as of 08/05/2021.    Review of Systems   Unable to obtain due to dementia.    Immunization History  Administered Date(s) Administered   Influenza Inj Mdck Quad With Preservative 10/11/2015   Influenza, High Dose Seasonal PF 11/21/2013   Influenza, Quadrivalent, Recombinant, Inj, Pf 10/01/2017, 10/12/2018, 10/27/2019   Influenza-Unspecified 11/22/2014, 11/12/2020   PFIZER(Purple Top)SARS-COV-2 Vaccination 02/16/2019, 03/09/2019, 10/26/2019, 06/24/2020, 10/14/2020   PPD Test 10/23/2005   Pneumococcal Conjugate-13 08/08/2013   Pneumococcal Polysaccharide-23 12/27/2011, 01/25/2018   Tdap 11/28/2009, 03/15/2021   Zoster, Live 03/15/2005   Zoster, Unspecified 09/01/2016, 12/02/2016   Pertinent  Health Maintenance Due  Topic Date Due   INFLUENZA VACCINE  08/25/2021   DEXA SCAN  Completed       No data to display           Vitals:   08/05/21 1203  BP: 140/76  Pulse: 64  Resp: 20  Temp: 97.8 F (36.6 C)  SpO2: 95%  Weight: 127 lb 3.2 oz (57.7 kg)  Height: 5' (1.524 m)   Body mass index is 24.84 kg/m.  Physical Exam Constitutional:      General: She is not in acute distress.    Appearance: Normal appearance.   HENT:     Head: Normocephalic and atraumatic.     Nose: Nose normal.     Mouth/Throat:     Mouth: Mucous membranes are moist.  Eyes:     Conjunctiva/sclera: Conjunctivae normal.  Cardiovascular:     Rate and Rhythm: Normal rate and regular rhythm.  Pulmonary:     Effort: Pulmonary effort is normal.     Breath sounds: Normal breath sounds.  Abdominal:     General: Bowel sounds are normal.     Palpations: Abdomen is soft.  Musculoskeletal:        General: Normal range of motion.     Cervical back: Normal range of motion.  Skin:    General: Skin is warm and dry.  Neurological:     Mental Status: Mental status is at baseline.     Comments: Alert to self, disoriented to time and place.  Psychiatric:        Mood and Affect: Mood normal.        Behavior: Behavior normal.        Labs reviewed: Recent Labs    08/20/20 0000 12/03/20 0000 02/12/21 0000  NA 138 140 135*  K 4.1 4.4 4.2  CL 99 101 99  CO2 29* 24* 27*  BUN '14 16 13  '$ CREATININE 0.7 0.7 0.7  CALCIUM 9.4 9.9 9.2   Recent Labs    08/20/20 0000 12/03/20 0000 02/12/21 0000  AST '22 23 30  '$ ALT '20 19 25  '$ ALKPHOS 57 71 66  ALBUMIN 3.7 4.1 3.6   Recent Labs    08/20/20 0000 12/03/20 0000 02/12/21 0000  WBC 7.6 7.3 6.4  NEUTROABS 3,595.00 3,811.00  --   HGB 13.9 14.7 13.6  HCT 41 44 40  PLT 294 276 274   Lab Results  Component Value Date   TSH 2.04 12/03/2020   No results found for: "HGBA1C" Lab Results  Component Value Date   CHOL 178 06/18/2020   HDL 77 (A) 06/18/2020   LDLCALC 86 06/18/2020   TRIG 61 06/18/2020    Significant Diagnostic Results in last 30 days:  No results found.  Assessment/Plan  1. Osteoporosis, unspecified osteoporosis type, unspecified pathological fracture presence -  stable, continue alendronate  2. Hyperlipidemia, unspecified hyperlipidemia type Lab Results  Component Value Date   CHOL 178 06/18/2020   HDL 77 (A) 06/18/2020   LDLCALC 86 06/18/2020   TRIG  61 06/18/2020   -    Continue rosuvastatin  3. Vitamin B12 deficiency Lab Results  Component Value Date   VITAMINB12 551 06/18/2020   -   Continue vitamin B12  4. Depression with anxiety -  no depressed mood per PHQ-9 score  of 0 -   Continue escitalopram, Depakote, Seroquel and Klonopin  5. Alzheimer's disease with behavioral disturbance ([place]) -Has severe cognitive impairment per BIMS scvore of 0/15, continue Aricept and Namenda -  continue care at memory unit   Family/ staff Communication: Discussed plan of care with charge nurse  Labs/tests ordered:   None    Durenda Age, DNP, MSN, FNP-BC [facility] and Adult Medicine [phone] (Monday-Friday 8:00 a.m. - 5:00 p.m.) [phone] (after hours)

## 2021-08-24 DIAGNOSIS — F411 Generalized anxiety disorder: Secondary | ICD-10-CM | POA: Diagnosis not present

## 2021-08-24 DIAGNOSIS — F02C11 Dementia in other diseases classified elsewhere, severe, with agitation: Secondary | ICD-10-CM | POA: Diagnosis not present

## 2021-08-26 ENCOUNTER — Non-Acute Institutional Stay (SKILLED_NURSING_FACILITY): Payer: Medicare PPO | Admitting: Adult Health

## 2021-08-26 ENCOUNTER — Encounter: Payer: Self-pay | Admitting: Adult Health

## 2021-08-26 DIAGNOSIS — F418 Other specified anxiety disorders: Secondary | ICD-10-CM | POA: Diagnosis not present

## 2021-08-26 DIAGNOSIS — R1312 Dysphagia, oropharyngeal phase: Secondary | ICD-10-CM | POA: Diagnosis not present

## 2021-08-26 DIAGNOSIS — G309 Alzheimer's disease, unspecified: Secondary | ICD-10-CM

## 2021-08-26 DIAGNOSIS — F02818 Dementia in other diseases classified elsewhere, unspecified severity, with other behavioral disturbance: Secondary | ICD-10-CM | POA: Diagnosis not present

## 2021-08-26 DIAGNOSIS — F39 Unspecified mood [affective] disorder: Secondary | ICD-10-CM | POA: Diagnosis not present

## 2021-08-26 NOTE — Progress Notes (Signed)
Location:  [facility] Room Number: 110A Place of Service:  SNF (31) Provider:  Durenda Age, DNP, FNP-BC  Patient Care Team: [name], MD as PCP - General (Internal Medicine)  Extended Emergency Contact Information Primary Emergency Contact: Sharrow,[name] Address: [address]          [place], [place] [address] [name] of [place] Phone: [phone] Relation: Spouse Secondary Emergency Contact: Sikkema,[name] Mobile Phone: [phone] Relation: Other  Code Status:  DNR  Goals of care: Advanced Directive information    08/05/2021   12:04 PM  Advanced Directives  Does Patient Have a Medical Advance Directive? Yes  Type of Paramedic of [place];Out of facility DNR (pink MOST or yellow form)  Does patient want to make changes to medical advance directive? No - Patient declined  Copy of [place] in Chart? Yes - validated most recent copy scanned in chart (See row information)  Pre-existing out of facility DNR order (yellow form or pink MOST form) Pink MOST form placed in chart (order not valid for inpatient use);Yellow form placed in chart (order not valid for inpatient use)     Chief Complaint  Patient presents with   Acute Visit    Difficulty Swallowing.     HPI:  Pt is a 76 y.o. female seen today for an acute visit regarding difficulty in swallowing. She is a long-term care resident of [facility]. She has a PMH COPD, osteoporosis, alzheimer's disease, depression and hyperlipidemia. She was seen sleeping on a recliner but woke up to verbal greetings. She is mostly nonverbal. No noted distress. Staff reported that she has been noted to have difficulty swallowing her food. She is needing frequent reminders to swallow her food. Latest BIMS score 0/15. She takes Aricept 5 mg at bedtime and Namenda 10 mg BID for dementia. Latest PHQ-9 score 0. She takes Escitalopram 10  mg daily and Klonopin 0.5 mg in the evening for Depression with anxiety.   Past Medical History:  Diagnosis Date   Abnormal weight loss    Age-related osteoporosis without current pathological fracture    Allergic rhinitis    Atherosclerosis of aorta (HCC)    Chronic obstructive pulmonary disease (COPD) (HCC)    Corns and callosities    Depression    H/O mammogram 2021   Per [facility] New Patient Packet   Hyperlipemia    Hyperlipidemia    Melanocytic nevi, unspecified    Mild cognitive impairment, so stated    Osteopenia    Osteoporosis    Other specified disorders of bone density and structure, unspecified site    Pain in unspecified ankle and joints of unspecified foot    Past Surgical History:  Procedure Laterality Date   COLONOSCOPY  2020   Per [facility] New Patient Packet    Allergies  Allergen Reactions   Penicillin G Rash    Welts    Outpatient Encounter Medications as of 08/26/2021  Medication Sig   acetaminophen (TYLENOL) 325 MG tablet Take 650 mg by mouth 2 (two) times daily as needed.   alendronate (FOSAMAX) 70 MG tablet Take 70 mg by mouth once a week. Take with a full glass of water on an empty stomach.   calcium carbonate (OSCAL) 1500 (600 Ca) MG TABS tablet Take 600 mg by mouth 2 (two) times daily with a meal.   Cholecalciferol (VITAMIN D) 50 MCG (2000 UT) tablet Take 2,000 Units by mouth daily.  clonazePAM (KLONOPIN) 0.5 MG tablet Take 1 tablet (0.5 mg total) by mouth every evening.   clonazePAM (KLONOPIN) 0.5 MG tablet Take 1 tablet (0.5 mg total) by mouth daily as needed for anxiety.   CYANOCOBALAMIN PO Take 3,000 mcg by mouth daily.   divalproex (DEPAKOTE) 125 MG DR tablet Take 125 mg by mouth daily.   divalproex (DEPAKOTE) 125 MG DR tablet Take 250 mg by mouth 2 (two) times daily.   donepezil (ARICEPT) 5 MG tablet Take 5 mg by mouth at bedtime.   escitalopram (LEXAPRO) 10 MG tablet Take 10 mg by mouth daily.   memantine (NAMENDA) 10 MG tablet Take 10 mg by mouth  2 (two) times daily.   Multiple Vitamin (MULTIVITAMIN) capsule Take by mouth daily. 400 mcg; amt: 1Tablet   QUEtiapine (SEROQUEL) 12.5 mg TABS tablet Take 12.5 mg by mouth every morning.   QUEtiapine (SEROQUEL) 25 MG tablet Take 25 mg by mouth at bedtime.   rosuvastatin (CRESTOR) 10 MG tablet Take 10 mg by mouth daily.   senna (SENOKOT) 8.6 MG tablet Take 2 tablets by mouth daily.   [DISCONTINUED] polyethylene glycol (MIRALAX / GLYCOLAX) 17 g packet Take 17 g by mouth daily as needed for mild constipation.   No facility-administered encounter medications on file as of 08/26/2021.    Review of Systems   Unable to obtain due to dementia.    Immunization History  Administered Date(s) Administered   Influenza Inj Mdck Quad With Preservative 10/11/2015   Influenza, High Dose Seasonal PF 11/21/2013   Influenza, Quadrivalent, Recombinant, Inj, Pf 10/01/2017, 10/12/2018, 10/27/2019   Influenza-Unspecified 11/22/2014, 11/12/2020   PFIZER(Purple Top)SARS-COV-2 Vaccination 02/16/2019, 03/09/2019, 10/26/2019, 06/24/2020, 10/14/2020   PPD Test 10/23/2005   Pneumococcal Conjugate-13 08/08/2013   Pneumococcal Polysaccharide-23 12/27/2011, 01/25/2018   Tdap 11/28/2009, 03/15/2021   Zoster, Live 03/15/2005   Zoster, Unspecified 09/01/2016, 12/02/2016   Pertinent  Health Maintenance Due  Topic Date Due   INFLUENZA VACCINE  08/25/2021   DEXA SCAN  Completed       No data to display           Vitals:   08/26/21 1354  BP: 130/68  Pulse: 62  Resp: 16  Temp: 97.8 F (36.6 C)  TempSrc: Skin  SpO2: 95%  Weight: 120 lb 3.2 oz (54.5 kg)  Height: '5\' 5"'$  (1.651 m)   Body mass index is 20 kg/m.  Physical Exam Constitutional:      General: She is not in acute distress.    Appearance: Normal appearance.  HENT:     Head: Normocephalic and atraumatic.     Nose: Nose normal.     Mouth/Throat:     Mouth: Mucous membranes are moist.  Eyes:     Conjunctiva/sclera: Conjunctivae normal.   Cardiovascular:     Rate and Rhythm: Normal rate and regular rhythm.  Pulmonary:     Effort: Pulmonary effort is normal.     Breath sounds: Normal breath sounds.  Abdominal:     General: Bowel sounds are normal.     Palpations: Abdomen is soft.  Musculoskeletal:        General: Normal range of motion.     Cervical back: Normal range of motion.  Skin:    General: Skin is warm and dry.  Neurological:     Mental Status: Mental status is at baseline. She is disoriented.  Psychiatric:        Mood and Affect: Mood normal.        Behavior: Behavior  normal.      Labs reviewed: Recent Labs    12/03/20 0000 02/12/21 0000  NA 140 135*  K 4.4 4.2  CL 101 99  CO2 24* 27*  BUN 16 13  CREATININE 0.7 0.7  CALCIUM 9.9 9.2   Recent Labs    12/03/20 0000 02/12/21 0000  AST 23 30  ALT 19 25  ALKPHOS 71 66  ALBUMIN 4.1 3.6   Recent Labs    12/03/20 0000 02/12/21 0000  WBC 7.3 6.4  NEUTROABS 3,811.00  --   HGB 14.7 13.6  HCT 44 40  PLT 276 274   Lab Results  Component Value Date   TSH 2.04 12/03/2020   No results found for: "HGBA1C" Lab Results  Component Value Date   CHOL 178 06/18/2020   HDL 77 (A) 06/18/2020   LDLCALC 86 06/18/2020   TRIG 61 06/18/2020    Significant Diagnostic Results in last 30 days:  No results found.  Assessment/Plan  1. Oropharyngeal dysphagia -  thought to be due to decline in her dementia -   will downgrade diet to mechanical soft with nectar thick liquid -  ST evaluation for appropriate food consistency -  aspiration precautions  2. 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 Alzheimer's disease with behavioral disturbance ([place]) -  has severe cognitive impairment, declining -  referred for hospice consult -   continue Aricept and Namenda -   continue care at memory care unit   Family/ staff  Communication: Discussed plan of care with charge nurse.  Labs/tests ordered:   None    Durenda Age, DNP, MSN, FNP-BC [facility] Adult [facility] [phone] (Monday-Friday 8:00 a.m. - 5:00 p.m.) [phone] (after hours)

## 2021-08-27 DIAGNOSIS — R1312 Dysphagia, oropharyngeal phase: Secondary | ICD-10-CM | POA: Diagnosis not present

## 2021-08-27 DIAGNOSIS — R1310 Dysphagia, unspecified: Secondary | ICD-10-CM | POA: Diagnosis not present

## 2021-09-25 DEATH — deceased

## 2022-03-16 IMAGING — MG MM DIGITAL SCREENING BILAT W/ TOMO AND CAD
8 series · 9 of 24 positions shown · non-contrast
Comparison: Previous exam(s).

CLINICAL DATA: Screening. Best images obtainable due to patient
condition/difficulty in positioning.

EXAM:
DIGITAL SCREENING BILATERAL MAMMOGRAM WITH TOMOSYNTHESIS AND CAD
TECHNIQUE: Bilateral screening digital craniocaudal and mediolateral oblique
mammograms were obtained. Bilateral screening digital breast
tomosynthesis was performed. The images were evaluated with
computer-aided detection.

[L MLO synth-2D]
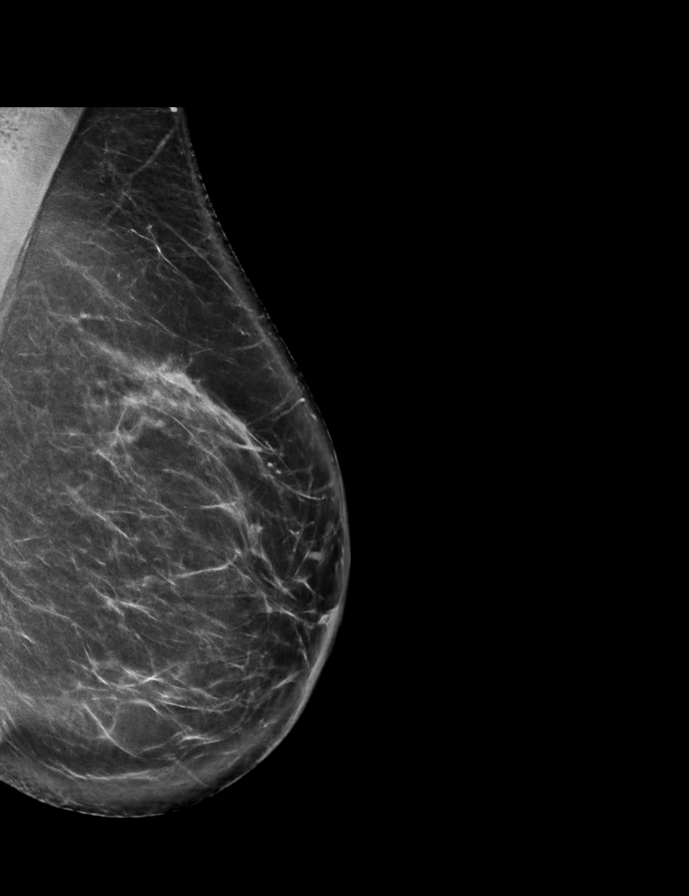

[R MLO synth-2D]
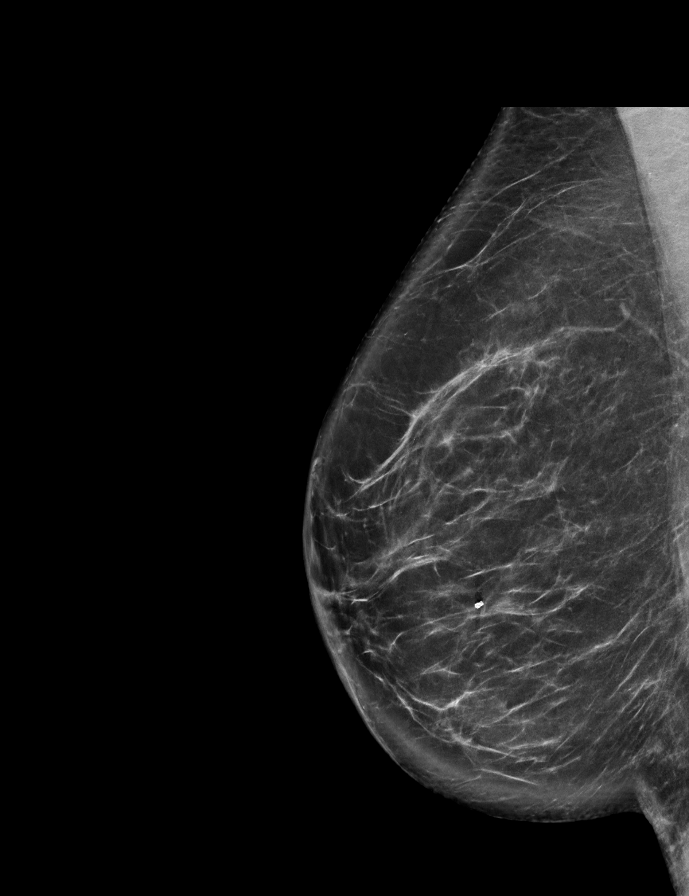

[L CC synth-2D]
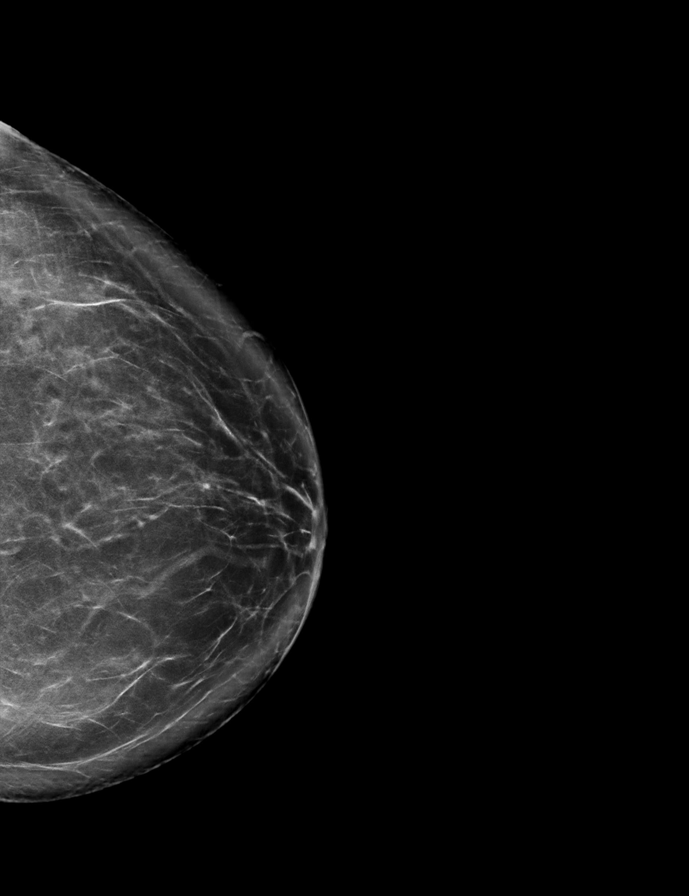

[R CC synth-2D]
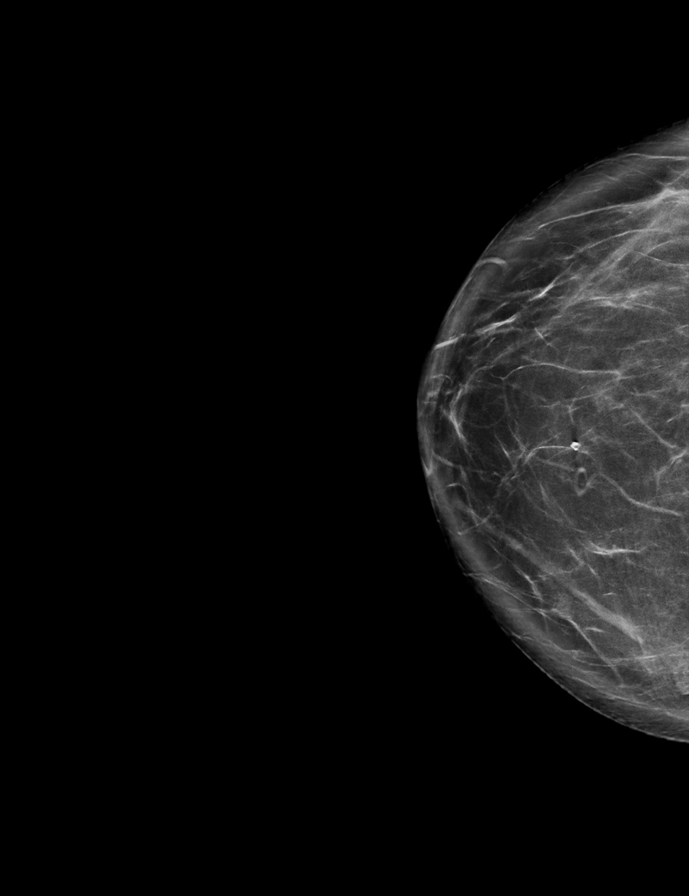

[L MLO tomo · 2 of 85 frames shown]
[frame 28/85]
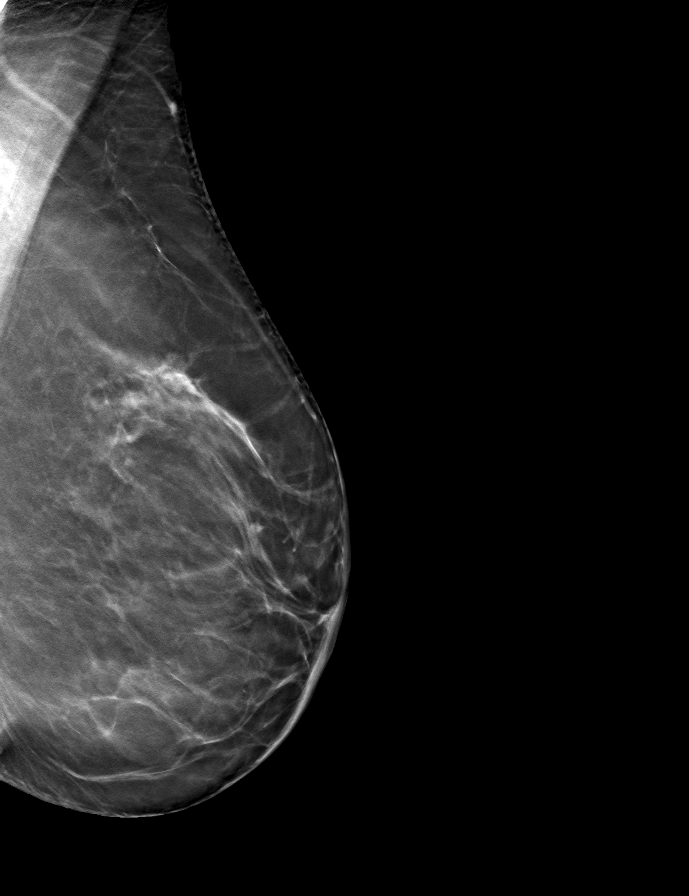
[frame 43/85]
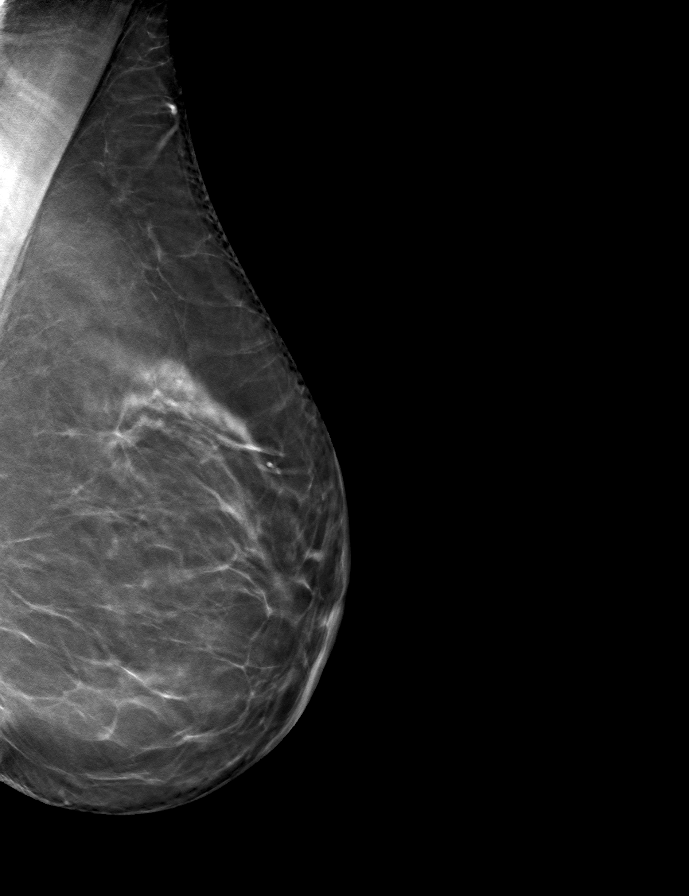

[R MLO tomo · tomo slice 42/83.0]
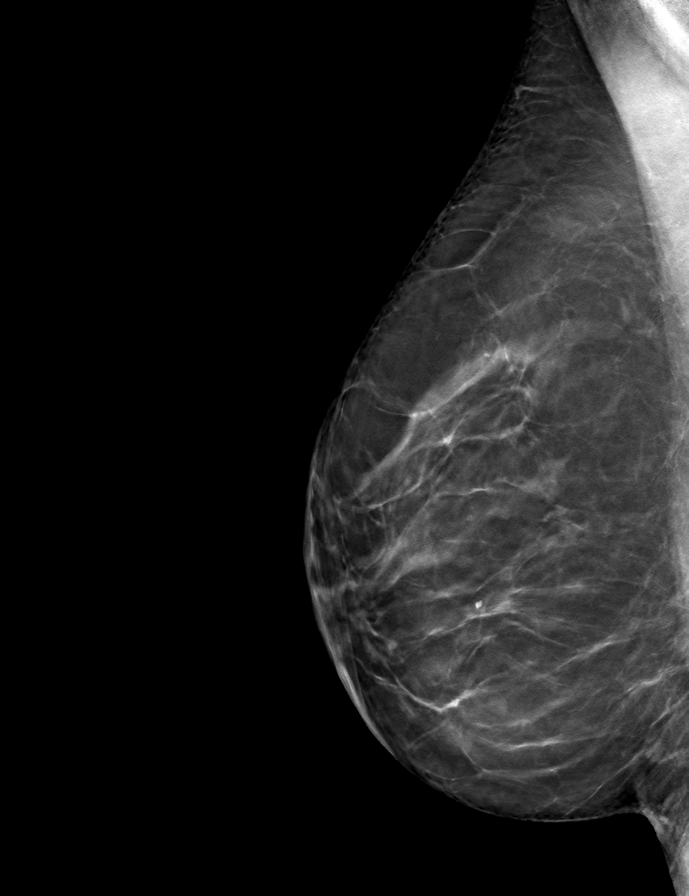

[L CC tomo · tomo slice 43/86.0]
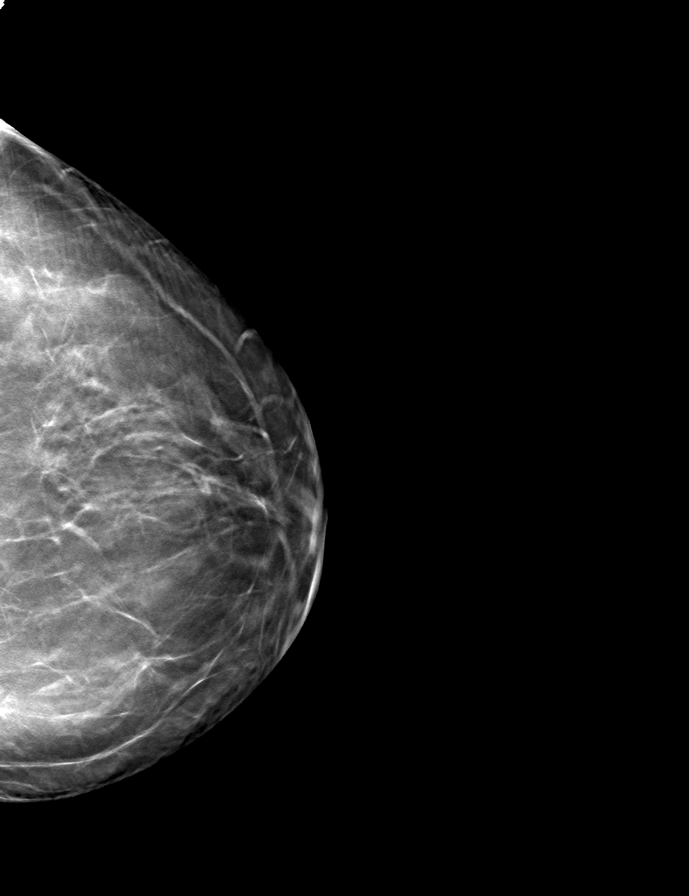

[R CC tomo · tomo slice 43/85.0]
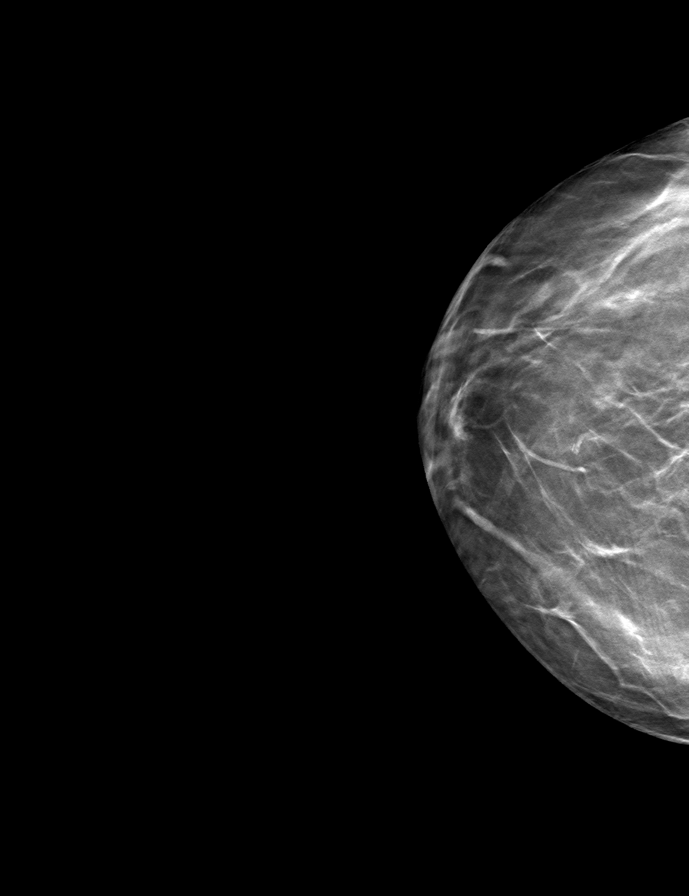

[9 of 24 positions shown; findings below may reference images not displayed]

ACR Breast Density Category b: There are scattered areas of
fibroglandular density.
FINDINGS: There are no findings suspicious for malignancy.
IMPRESSION: No mammographic evidence of malignancy. A result letter of this
screening mammogram will be mailed directly to the patient.

RECOMMENDATION:
Screening mammogram in one year. (Code:69-O-MWU)

BI-RADS CATEGORY  1: Negative.
